# Patient Record
Sex: Male | Born: 1990 | Race: Black or African American | Hispanic: No | Marital: Single | Smoking: Never smoker
Health system: Southern US, Community
[De-identification: ages and names within clinical notes are randomized; demographics above are authoritative.]

## PROBLEM LIST (undated history)

## (undated) DIAGNOSIS — J45909 Unspecified asthma, uncomplicated: Secondary | ICD-10-CM

## (undated) DIAGNOSIS — R0981 Nasal congestion: Secondary | ICD-10-CM

## (undated) DIAGNOSIS — R040 Epistaxis: Secondary | ICD-10-CM

## (undated) HISTORY — PX: OTHER SURGICAL HISTORY: SHX169

## (undated) HISTORY — DX: Nasal congestion: R09.81

## (undated) NOTE — *Deleted (*Deleted)
Perennial and seasonal allergic rhinitis Continue Xyzal 5 mg once a day as needed for runny nose Continue azelastine/fluticasone nasal spray using 1 spray each nostril twice a day as needed for runny/stuffy nsoe May use saline nasal rinse or saline nasal spray as needed for nasal symptoms. Use this prior to any medicated nasal spray  Consider allergy injections as a means of long-term control. - Allergy injections "re-train" and "reset" the immune system to ignore environmental allergens and decrease the resulting immune response to those allergens (sneezing, itchy watery eyes, runny nose, nasal congestion, etc).    - Allergy injections improve symptoms in 75-85% of patients.   - We can discuss this more at the next appointment if the medications are not working for you.  Allergic conjunctivitis Continue Pataday 1 drop each eye once a day as needed for itchy watery eyes May use Natural Tears ( eye lubricant) as needed.  Please let us know if this treatment plan is not working well for you. Schedule a follow up appointment in

---
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
## 2011-05-01 ENCOUNTER — Encounter: Payer: Self-pay | Admitting: *Deleted

## 2011-05-01 ENCOUNTER — Emergency Department (HOSPITAL_BASED_OUTPATIENT_CLINIC_OR_DEPARTMENT_OTHER)
Admission: EM | Admit: 2011-05-01 | Discharge: 2011-05-01 | Disposition: A | Discharge status: Home / Self Care | Payer: Self-pay | Attending: Emergency Medicine | Admitting: Emergency Medicine

## 2011-05-01 DIAGNOSIS — H6690 Otitis media, unspecified, unspecified ear: Secondary | ICD-10-CM

## 2011-05-01 DIAGNOSIS — H9209 Otalgia, unspecified ear: Secondary | ICD-10-CM | POA: Insufficient documentation

## 2011-05-01 MED ORDER — ANTIPYRINE-BENZOCAINE 5.4-1.4 % OT SOLN
3.0000 [drp] | OTIC | Status: DC | PRN
  Administered 2011-05-01: 2 [drp] via OTIC
  Filled 2011-05-01: qty 10

## 2011-05-01 MED ORDER — AMOXICILLIN 500 MG PO CAPS: 500.0000 mg | ORAL_CAPSULE | Freq: Three times a day (TID) | ORAL | Status: AC

## 2011-05-01 NOTE — ED Notes (Signed)
Pt c/o right ear pain.

## 2011-05-01 NOTE — ED Provider Notes (Signed)
Medical screening examination/treatment/procedure(s) were performed by non-physician practitioner and as supervising physician I was immediately available for consultation/collaboration.   Lyanne Co, MD 05/01/11 [phone]

## 2011-05-01 NOTE — ED Provider Notes (Signed)
History     CSN: [id number] Arrival date & time: 05/01/2011  7:06 PM  Chief Complaint  Patient presents with  . Otalgia    (Consider location/radiation/quality/duration/timing/severity/associated sxs/prior treatment) Patient is a 20 y.o. male presenting with ear pain. The history is provided by the patient. No language interpreter was used.  Otalgia This is a new problem. The current episode started 3 to 5 hours ago. There is pain in the right ear. The problem occurs constantly. The problem has not changed since onset.There has been no fever. The pain is severe. Pertinent negatives include no rhinorrhea, no sore throat, no neck pain and no rash.    History reviewed. No pertinent past medical history.  History reviewed. No pertinent past surgical history.  History reviewed. No pertinent family history.  History  Substance Use Topics  . Smoking status: Never Smoker   . Smokeless tobacco: Not on file  . Alcohol Use: No      Review of Systems  Constitutional: Negative.   HENT: Positive for ear pain. Negative for sore throat, rhinorrhea and neck pain.   Respiratory: Negative.   Cardiovascular: Negative.   Skin: Negative for rash.  Neurological: Negative.     Allergies  Review of patient's allergies indicates no known allergies.  Home Medications   Current Outpatient Rx  Name Route Sig Dispense Refill  . AMOXICILLIN 500 MG PO CAPS Oral Take 1 capsule (500 mg total) by mouth 3 (three) times daily. 30 capsule 0    BP 140/89  Pulse 65  Temp(Src) 98.1 F (36.7 C) (Oral)  Resp 16  Ht 5\' 11"  (1.803 m)  Wt 180 lb (81.647 kg)  BMI 25.10 kg/m2  SpO2 100%  Physical Exam  Nursing note and vitals reviewed. Constitutional: He appears well-developed and well-nourished.  HENT:  Head: Normocephalic.  Right Ear: Tympanic membrane is erythematous and bulging.  Left Ear: Tympanic membrane normal.  Mouth/Throat: Oropharynx is clear and moist.  Eyes: Pupils are equal, round,  and reactive to light.  Neck: Normal range of motion. Neck supple.  Cardiovascular: Normal rate and regular rhythm.   Pulmonary/Chest: Effort normal and breath sounds normal.  Musculoskeletal: Normal range of motion.    ED Course  Procedures (including critical care time)  Labs Reviewed - No data to display No results found.   1. Otitis media       MDM  Will treat for otitis        Teressa Lower, NP 05/01/11 1952

## 2012-07-12 ENCOUNTER — Emergency Department (HOSPITAL_BASED_OUTPATIENT_CLINIC_OR_DEPARTMENT_OTHER): Payer: Self-pay

## 2012-07-12 ENCOUNTER — Encounter (HOSPITAL_BASED_OUTPATIENT_CLINIC_OR_DEPARTMENT_OTHER): Payer: Self-pay | Admitting: *Deleted

## 2012-07-12 ENCOUNTER — Emergency Department (HOSPITAL_BASED_OUTPATIENT_CLINIC_OR_DEPARTMENT_OTHER)
Admission: EM | Admit: 2012-07-12 | Discharge: 2012-07-12 | Disposition: A | Discharge status: Home / Self Care | Payer: Self-pay | Attending: Emergency Medicine | Admitting: Emergency Medicine

## 2012-07-12 DIAGNOSIS — R079 Chest pain, unspecified: Secondary | ICD-10-CM

## 2012-07-12 DIAGNOSIS — R072 Precordial pain: Secondary | ICD-10-CM | POA: Insufficient documentation

## 2012-07-12 DIAGNOSIS — R42 Dizziness and giddiness: Secondary | ICD-10-CM | POA: Insufficient documentation

## 2012-07-12 DIAGNOSIS — F1210 Cannabis abuse, uncomplicated: Secondary | ICD-10-CM | POA: Insufficient documentation

## 2012-07-12 HISTORY — DX: Epistaxis: R04.0

## 2012-07-12 NOTE — ED Provider Notes (Signed)
History  This chart was scribed for Geoffery Lyons, MD by Shari Heritage, ED Scribe. The patient was seen in room MH11/MH11. Patient's care was started at 1704.  CSN: [id number]  Arrival date & time 07/12/12  1632   First MD Initiated Contact with Patient 07/12/12 1704      Chief Complaint  Patient presents with  . Chest Pain    The history is provided by the patient. No language interpreter was used.   HPI Comments: Jimmy Mcdaniel is a 21 y.o. male who presents to the Emergency Department complaining of intermittent, moderate, non-radiating chest pain onset 2 weeks ago. Patient describes the pain as sharp or cramping in the midsternal and left chest areas. He states that he gets pain episodes up 10 minutes long 1 to 2 times per day. The pain is not worse with exertion or after eating certain foods. Patient states that he became lightheaded a couple of days ago with the pain, but no other consistent symptoms. Patient denies shortness of breath. Patient has never had an EKG done before. Patient says that he works at a [employer] and does occasional heavy lifting. Patient has no significant past medical history. Patient does not smoke cigarettes or cigars. He smokes marijuana occasionally.    Past Medical History  Diagnosis Date  . Nosebleed     Past Surgical History  Procedure Date  . Head surgery     History reviewed. No pertinent family history.  History  Substance Use Topics  . Smoking status: Current Some Day Smoker  . Smokeless tobacco: Not on file  . Alcohol Use: No      Review of Systems  Respiratory: Negative for shortness of breath.   Cardiovascular: Positive for chest pain.  Neurological: Positive for light-headedness.  All other systems reviewed and are negative.    Allergies  Review of patient's allergies indicates no known allergies.  Home Medications  No current outpatient prescriptions on file.  Triage Vitals: BP 120/82  Pulse 58  Temp 98.1  F (36.7 C) (Oral)  Resp 18  Ht 5\' 11"  (1.803 m)  Wt 185 lb (83.915 kg)  BMI 25.80 kg/m2  SpO2 100%  Physical Exam  Constitutional: He is oriented to person, place, and time. He appears well-developed and well-nourished. No distress.  HENT:  Head: Normocephalic and atraumatic.  Eyes: Conjunctivae normal and EOM are normal. Pupils are equal, round, and reactive to light.  Neck: Neck supple.  Cardiovascular: Normal rate and regular rhythm.   No murmur heard. Pulmonary/Chest: Effort normal and breath sounds normal. No respiratory distress. He has no wheezes. He has no rales. He exhibits no tenderness.  Abdominal: Soft. Bowel sounds are normal. He exhibits no distension. There is no tenderness. There is no rebound and no guarding.  Musculoskeletal: He exhibits no edema and no tenderness.  Neurological: He is alert and oriented to person, place, and time.  Skin: Skin is warm and dry. No rash noted.    ED Course  Procedures (including critical care time) DIAGNOSTIC STUDIES: Oxygen Saturation is 100% on room air, normal by my interpretation.    COORDINATION OF CARE: 5:10 PM- Patient informed of current plan for treatment and evaluation and agrees with plan at this time. Will order chest x-ray to rule out significant medical conditions.   Labs Reviewed - No data to display  Dg Chest 2 View  07/12/2012  *RADIOLOGY REPORT*  Clinical Data: Chest pain  CHEST - 2 VIEW  Comparison: 07/06/2010  Findings:  Upper-normal size of cardiac silhouette. Mediastinal contours and pulmonary vascularity normal. Minimal chronic peribronchial thickening. No pulmonary infiltrate, pleural effusion, or pneumothorax. Bones unremarkable.  IMPRESSION: No acute abnormalities.   Original Report Authenticated By: Ulyses Southward, M.D.      No diagnosis found.   Date: 07/12/2012  Rate: 59  Rhythm: sinus bradycardia  QRS Axis: normal  Intervals: normal  ST/T Wave abnormalities: early repolarization  Conduction  Disutrbances:none  Narrative Interpretation:   Old EKG Reviewed: none available    MDM  Very low suspicion for cardiac etiology.  No risk factors, symptoms atypical, and workup unremarkable. Will discharge to home with nsaids, return prn.      I personally performed the services described in this documentation, which was scribed in my presence. The recorded information has been reviewed and is accurate.      Geoffery Lyons, MD 07/12/12 2114

## 2012-07-12 NOTE — ED Notes (Signed)
MD at bedside. 

## 2012-07-12 NOTE — Discharge Instructions (Signed)
Chest Pain (Nonspecific) It is often hard to give a specific diagnosis for the cause of chest pain. There is always a chance that your pain could be related to something serious, such as a heart attack or a blood clot in the lungs. You need to follow up with your caregiver for further evaluation. CAUSES   Heartburn.  Pneumonia or bronchitis.  Anxiety or stress.  Inflammation around your heart (pericarditis) or lung (pleuritis or pleurisy).  A blood clot in the lung.  A collapsed lung (pneumothorax). It can develop suddenly on its own (spontaneous pneumothorax) or from injury (trauma) to the chest.  Shingles infection (herpes zoster virus). The chest wall is composed of bones, muscles, and cartilage. Any of these can be the source of the pain.  The bones can be bruised by injury.  The muscles or cartilage can be strained by coughing or overwork.  The cartilage can be affected by inflammation and become sore (costochondritis). DIAGNOSIS  Lab tests or other studies, such as X-rays, electrocardiography, stress testing, or cardiac imaging, may be needed to find the cause of your pain.  TREATMENT   Treatment depends on what may be causing your chest pain. Treatment may include:  Acid blockers for heartburn.  Anti-inflammatory medicine.  Pain medicine for inflammatory conditions.  Antibiotics if an infection is present.  You may be advised to change lifestyle habits. This includes stopping smoking and avoiding alcohol, caffeine, and chocolate.  You may be advised to keep your head raised (elevated) when sleeping. This reduces the chance of acid going backward from your stomach into your esophagus.  Most of the time, nonspecific chest pain will improve within 2 to 3 days with rest and mild pain medicine. HOME CARE INSTRUCTIONS   If antibiotics were prescribed, take your antibiotics as directed. Finish them even if you start to feel better.  For the next few days, avoid physical  activities that bring on chest pain. Continue physical activities as directed.  Do not smoke.  Avoid drinking alcohol.  Only take over-the-counter or prescription medicine for pain, discomfort, or fever as directed by your caregiver.  Follow your caregiver's suggestions for further testing if your chest pain does not go away.  Keep any follow-up appointments you made. If you do not go to an appointment, you could develop lasting (chronic) problems with pain. If there is any problem keeping an appointment, you must call to reschedule. SEEK MEDICAL CARE IF:   You think you are having problems from the medicine you are taking. Read your medicine instructions carefully.  Your chest pain does not go away, even after treatment.  You develop a rash with blisters on your chest. SEEK IMMEDIATE MEDICAL CARE IF:   You have increased chest pain or pain that spreads to your arm, neck, jaw, back, or abdomen.  You develop shortness of breath, an increasing cough, or you are coughing up blood.  You have severe back or abdominal pain, feel nauseous, or vomit.  You develop severe weakness, fainting, or chills.  You have a fever. THIS IS AN EMERGENCY. Do not wait to see if the pain will go away. Get medical help at once. Call your local emergency services (911 in [place] Do not drive yourself to the hospital. MAKE SURE YOU:   Understand these instructions.  Will watch your condition.  Will get help right away if you are not doing well or get worse. Document Released: 04/17/2005 Document Revised: 09/30/2011 Document Reviewed: 02/11/2008 [employer] Patient Information 2013 [employer].  

## 2012-07-12 NOTE — ED Notes (Signed)
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 Denies other s/s except nosebleeds, which he has frequently. Admits to marijuana use.

## 2012-07-12 NOTE — ED Notes (Signed)
Patient transported to X-ray and returned 

## 2015-09-06 ENCOUNTER — Emergency Department (HOSPITAL_BASED_OUTPATIENT_CLINIC_OR_DEPARTMENT_OTHER)
Admission: EM | Admit: 2015-09-06 | Discharge: 2015-09-06 | Disposition: A | Discharge status: Home / Self Care | Payer: Self-pay | Attending: Emergency Medicine | Admitting: Emergency Medicine

## 2015-09-06 ENCOUNTER — Encounter (HOSPITAL_BASED_OUTPATIENT_CLINIC_OR_DEPARTMENT_OTHER): Payer: Self-pay

## 2015-09-06 DIAGNOSIS — Y998 Other external cause status: Secondary | ICD-10-CM | POA: Insufficient documentation

## 2015-09-06 DIAGNOSIS — W228XXA Striking against or struck by other objects, initial encounter: Secondary | ICD-10-CM | POA: Insufficient documentation

## 2015-09-06 DIAGNOSIS — S99922A Unspecified injury of left foot, initial encounter: Secondary | ICD-10-CM

## 2015-09-06 DIAGNOSIS — Y9389 Activity, other specified: Secondary | ICD-10-CM | POA: Insufficient documentation

## 2015-09-06 DIAGNOSIS — Y9289 Other specified places as the place of occurrence of the external cause: Secondary | ICD-10-CM | POA: Insufficient documentation

## 2015-09-06 DIAGNOSIS — Z23 Encounter for immunization: Secondary | ICD-10-CM | POA: Insufficient documentation

## 2015-09-06 DIAGNOSIS — S90412A Abrasion, left great toe, initial encounter: Secondary | ICD-10-CM | POA: Insufficient documentation

## 2015-09-06 MED ORDER — TETANUS-DIPHTH-ACELL PERTUSSIS 5-2.5-18.5 LF-MCG/0.5 IM SUSP
0.5000 mL | Freq: Once | INTRAMUSCULAR | Status: AC
  Administered 2015-09-06: 0.5 mL via INTRAMUSCULAR
  Filled 2015-09-06: qty 0.5

## 2015-09-06 MED ORDER — BACITRACIN ZINC 500 UNIT/GM EX OINT
TOPICAL_OINTMENT | Freq: Two times a day (BID) | CUTANEOUS | Status: DC
  Administered 2015-09-06: 1 via TOPICAL

## 2015-09-06 MED ORDER — BUPIVACAINE HCL (PF) 0.5 % IJ SOLN
10.0000 mL | Freq: Once | INTRAMUSCULAR | Status: AC
  Administered 2015-09-06: 10 mL
  Filled 2015-09-06: qty 10

## 2015-09-06 NOTE — ED Provider Notes (Signed)
CSN: [id number]     Arrival date & time 09/06/15  4098 History   First MD Initiated Contact with Patient 09/06/15 0915     Chief Complaint  Patient presents with  . Toe Injury     (Consider location/radiation/quality/duration/timing/severity/associated sxs/prior Treatment) HPI   Jimmy Mcdaniel is a 25 y.o. male, patient with no pertinent past medical history, presenting to the ED with injury to the left great toe that occurred yesterday. Patient states that he hit the toe against a bedpost. Complains of a small wound and that part of the nail fell off. Patient has not done anything to treat this wound. Patient rates his pain at 6 out of 10, throbbing, nonradiating. Patient denies falls, other injuries, neuro deficits, or any other complaints.    Past Medical History  Diagnosis Date  . Nosebleed    Past Surgical History  Procedure Laterality Date  . Head surgery     History reviewed. No pertinent family history. Social History  Substance Use Topics  . Smoking status: Never Smoker   . Smokeless tobacco: Never Used  . Alcohol Use: Yes     Comment: social    Review of Systems  Skin: Positive for wound (Left great toe and nail).  Neurological: Negative for weakness and numbness.      Allergies  Review of patient's allergies indicates no known allergies.  Home Medications   Prior to Admission medications   Not on File   BP 117/61 mmHg  Pulse 48  Temp(Src) 97.5 F (36.4 C) (Oral)  Resp 18  Ht 6' (1.829 m)  Wt 95.255 kg  BMI 28.47 kg/m2  SpO2 98% Physical Exam  Constitutional: He appears well-developed and well-nourished. No distress.  HENT:  Head: Normocephalic and atraumatic.  Eyes: Conjunctivae are normal. Pupils are equal, round, and reactive to light.  Cardiovascular: Normal rate and regular rhythm.   Pulmonary/Chest: Effort normal.  Musculoskeletal:  Range of motion intact. No deformity, swelling, erythema, or tenderness proximal to the nail.   Neurological: He is alert.  No sensory deficits.  Skin: Skin is warm and dry. He is not diaphoretic.  Small abrasion to the superior lateral section of the left great toe. Partial nail is missing. Small fragment still present. Tenderness to the superior left great toe.  Nursing note and vitals reviewed.   ED Course  .Nerve Block Date/Time: 09/06/2015 9:34 AM Performed by: Anselm Pancoast Authorized by: Anselm Pancoast Consent: Verbal consent obtained. Risks and benefits: risks, benefits and alternatives were discussed Consent given by: patient Patient understanding: patient states understanding of the procedure being performed Patient consent: the patient's understanding of the procedure matches consent given Procedure consent: procedure consent matches procedure scheduled Patient identity confirmed: verbally with patient and arm band Time out: Immediately prior to procedure a "time out" was called to verify the correct patient, procedure, equipment, support staff and site/side marked as required. Indications: pain relief Body area: lower extremity Nerve: digital Laterality: left Patient sedated: no Preparation: Patient was prepped and draped in the usual sterile fashion. Patient position: supine Needle gauge: 25 G Location technique: anatomical landmarks Local anesthetic: bupivacaine 0.5% without epinephrine Anesthetic total: 2 ml Outcome: pain improved Patient tolerance: Patient tolerated the procedure well with no immediate complications  .Nail Removal Date/Time: 09/06/2015 9:34 AM Performed by: Anselm Pancoast Authorized by: Harolyn Rutherford C Consent: Verbal consent obtained. Risks and benefits: risks, benefits and alternatives were discussed Consent given by: patient Patient understanding: patient states understanding of the procedure  being performed Patient consent: the patient's understanding of the procedure matches consent given Procedure consent: procedure consent matches  procedure scheduled Patient identity confirmed: verbally with patient and arm band Time out: Immediately prior to procedure a "time out" was called to verify the correct patient, procedure, equipment, support staff and site/side marked as required. Location: left foot Location details: left big toe Anesthesia: digital block Local anesthetic: bupivacaine 0.5% without epinephrine Anesthetic total: 2 ml Patient sedated: no Preparation: skin prepped with Betadine and sterile field established Amount removed: 1/5 Wedge excision of skin of nail fold: no Nail bed sutured: no Nail matrix removed: none Removed nail replaced and anchored: no Dressing: 4x4 and antibiotic ointment Patient tolerance: Patient tolerated the procedure well with no immediate complications   (including critical care time)   MDM   Final diagnoses:  Toe injury, left, initial encounter    Jimmy Mcdaniel presents with injury to left great toe.  This patient does not have any indications for imaging at this time. Regardless, the patient was offered an x-ray, but declined. Patient was given the option to have the rest of the nail fragment removed or to perform home wound care and see how the wound faired. Patient opted for the digital block with the partial nail removal. Tetanus updated here in the ED. patient tolerated nail removal well. Patient given instructions for home wound care as well as return precautions. Patient voiced understanding of these instructions and is comfortable with the plan.    Anselm Pancoast, PA-C 09/06/15 1655  Derwood Kaplan, MD 09/08/15 [phone]

## 2015-09-06 NOTE — Discharge Instructions (Signed)
You have been seen today for a toe injury. Follow up with PCP as needed. Return to ED should symptoms worsen. Keep the area clean and dry as much as possible. Soak in Epsom salt pads daily. Return to the ED or go to a PCP office should signs of infection occur.   Emergency Department Resource Guide 1) Find a Doctor and Pay Out of Pocket Although you won't have to find out who is covered by your insurance plan, it is a good idea to ask around and get recommendations. You will then need to call the office and see if the doctor you have chosen will accept you as a new patient and what types of options they offer for patients who are self-pay. Some doctors offer discounts or will set up payment plans for their patients who do not have insurance, but you will need to ask so you aren't surprised when you get to your appointment.  2) Contact Your Local Health Department Not all health departments have doctors that can see patients for sick visits, but many do, so it is worth a call to see if yours does. If you don't know where your local health department is, you can check in your phone book. The CDC also has a tool to help you locate your state's health department, and many state websites also have listings of all of their local health departments.  3) Find a Walk-in Clinic If your illness is not likely to be very severe or complicated, you may want to try a walk in clinic. These are popping up all over the country in pharmacies, drugstores, and shopping centers. They're usually staffed by nurse practitioners or physician assistants that have been trained to treat common illnesses and complaints. They're usually fairly quick and inexpensive. However, if you have serious medical issues or chronic medical problems, these are probably not your best option.  No Primary Care Doctor: - Call Health Connect at  [phone] - they can help you locate a primary care doctor that  accepts your insurance, provides certain  services, etc. - Physician Referral Service- [phone]  Chronic Pain Problems: Organization         Address  Phone   Notes  [facility]  [phone] Patients need to be referred by their primary care doctor.   Medication Assistance: Organization         Address  Phone   Notes  [facility] Medication [facility] [address] [place] [place], [place] [address] [phone] --Must be a resident of [facility] -- Must have NO insurance coverage whatsoever (no Medicaid/ Medicare, etc.) -- The pt. MUST have a primary care doctor that directs their care regularly and follows them in the community   [facility]  [phone]   [employer]  [phone]    Agencies that provide inexpensive medical care: Organization         Address  Phone   Notes  [facility]  [phone]   [facility]    [phone]   [facility] [address] [place], [place] [address] [phone]   [facility] [address] [place], [place] [phone]   Planned Parenthood    8567172928   [facility]    [phone]   [facility]  [address], [place] Phone:  [phone], Fax:  [phone] Hours of Operation:  9 am - 6 pm, M-F.  Also accepts Medicaid/Medicare and self-pay.  [facility]  [address], [place] Phone: [phone], Fax: [phone]. Hours of Operation:  8:30 am - 5:30 pm, M-F.  Also accepts Medicaid and self-pay.  [facility] [place] [address] [place] Phone: [phone]   [facility] [address] Natasha Bence [place], [place] [phone], Ext. [phone] Mondays & Thursdays: 7-9 AM.  First 15 patients are seen on a first come, first serve basis.    Medicaid-accepting [facility] Providers:  Organization         Address  Phone   Notes  [facility] [address], [place] [phone] Also accepts self-pay patients.  [facility] [address] Laurell Josephs [place], [place]  [phone]   [facility] [address], [place] [phone]   [facility] [address], [place] [phone]   Renaye Rakers [address], [place]   [phone] Only accepts [place] Access [place] patients after they have their name applied to their card.   Self-Pay (no insurance) in [facility]:  Organization         Address  Phone   Notes  Sickle Cell Patients, [facility] [address] [place], [place] [phone]   [facility] [address] [place], [place] [phone]   [facility] [place]  [address], [place] [phone]   [facility]/Dr. Osei-Bonsu  [address], [place] or [address], [place] [phone] Phone number for both [place] and [place] locations is the same.  [facility] [address] [place] [phone]   [facility] [address], [place] or [address] [phone]   [facility] [address] [place] [phone], phone; [phone], fax Sees patients 1st and 3rd Saturday of every month.  Must not qualify for public or private insurance (i.e. Medicaid, Medicare, [place] Health Choice, Veterans' Benefits)  Household income should be no more than 200% of the poverty level The clinic cannot treat you if you are pregnant or think you are pregnant  Sexually transmitted diseases are not treated at the clinic.    Dental Care: Organization         Address  Phone  Notes  [facility] Department of [facility] [address] [place], [place] [phone] Accepts  children up to age 25 who are enrolled in [place] or [place] Health Choice; pregnant women with a Medicaid card; and children who have applied for Medicaid or [place] Health Choice, but were declined, whose parents can pay a reduced fee at time of service.  [facility] Department of [facility]  [address], [place] [phone] Accepts children up to age 77 who are enrolled in [place] or [place] Health Choice; pregnant women with a Medicaid card; and children who have applied for Medicaid or [place] Health Choice, but were declined, whose parents can pay a reduced fee at time of service.  [facility]  [address] [place], [place] [phone] Patients are seen by appointment only. Walk-ins are not accepted. [facility] will see patients 18 years  of age and older. Monday - Tuesday (8am-5pm) Most Wednesdays (8:30-5pm) $30 per visit, cash only  [facility]  [address], [facility] [phone] Patients are seen by appointment only. Walk-ins are not accepted. [facility] will see patients 22 years of age and older. One Wednesday Evening (Monthly: Volunteer Based).  $30 per visit, cash only  [employer]  [phone] for adults; Children under age 53, call Graduate Pediatric Dentistry at [phone]. Children aged 43-14, please call [phone] to request a pediatric application.  Dental services are provided in all areas of dental care including fillings, crowns and bridges, complete and partial dentures, implants, gum treatment, root canals, and extractions. Preventive care is also provided. Treatment is provided to both adults and children. Patients are selected via a lottery and there is often a waiting list.   [facility] [address], [place]  [phone] [email or web address]   [facility] [address] [place], [place] [phone], Ext. [phone] Second and  Fourth Thursday of each month, opens at 6:30 AM; Clinic ends at 9 AM.  Patients are seen on a first-come first-served basis, and a limited number are seen during each clinic.   [facility]  [address] Ether Griffins [place], [place] [phone]   Eligibility Requirements You must have lived in [place], [place], or [place] counties for at least the last three months.   You cannot be eligible for state or federal sponsored [place], including [employer], [place], or [employer].   You generally cannot be eligible for healthcare insurance through your employer.    How to apply: Eligibility screenings are held every Tuesday and Wednesday afternoon from 1:00 pm until 4:00 pm. You do not need an appointment for the interview!  [facility] [address] [place], [place] [phone]   [facility] Health Department  [phone]   [facility] Health Department  [phone]   [facility] Health Department  [phone]    Behavioral Health Resources in the Community: Intensive Outpatient Programs Organization         Address  Phone  Notes  [facility] [address], [place], [place] [phone]   [facility] [address] [place], [place] [phone]   [facility] [address], [place], [place]  [phone]   [facility] [address]  [place], [place] [phone] or [phone]   Substance Abuse Resources Organization         Address  Phone  Notes  [facility]  [phone]   [facility]  [phone]   [facility]  [phone]   Floydene Flock  [phone]   Residential & Outpatient Substance Abuse Program  [phone]   Psychological Services Organization         Address  Phone  Notes  [facility]  [phone]   [facility] Services  [phone]   [facility]  [address] [place] [phone] or [phone]    Mobile Crisis Teams Organization         Address  Phone  Notes  [facility], Mobile Crisis Care Unit  [phone]   [facility]  [address] [place], [place] [phone]   [facility] [address] [place] [phone]    Self-Help/Support Groups Organization  Address  Phone             Notes  [facility] [place] - variety of support groups  336- I7437963 Call for more information  Narcotics Anonymous (NA), [facility] [address], [employer] [place]  2 meetings at this location   Statistician         Address  Phone  Notes  [facility] [address],    [place]  [phone]   [facility]  [address] [place] [address], [place], [place] [phone]   [facility] [address] [place], [place] [phone] Admissions: 8am-3pm M-F  [facility] [address]    [place], [place] [phone]   [facility] [address] [place], [place], [place] [phone]   [facility] [address],  [place], [place] [phone]   [facility] - Intensive Outpatient [address]ure[address], [place], [place] [phone]   [facility] ([facility] [address] [place]  [place], [place] [phone] or [phone]   [facility] ([facility]) [address] [place], [place] [phone] Accepts Medicaid  [facility] [address],  [place] [phone] Substance Abuse/Addiction Treatment   [facility] Organization         Address  Phone  Notes  [facility]  [phone]   Angie Fava, PhD [address] Ervin Knack [place], [place]   [phone] or [phone]   [facility] Behavioral   [address] [place], [place] [phone]   [facility] [address], [place], [place] [phone] Insurance/Medicaid/sponsorship through [facility] and Families [address]                                    [place], [place] [phone] Therapy/tele-psych/case  [facility] [address], [place] [phone]    Dr. Lolly Mustache  [phone]   [facility]  [employer] [facility] Dept. 1) [address] [place] [address], [place] 2) [address], [place] 3)  [address], [place] [phone]  [phone]   [facility] Child Abuse Hotline (579)432-4774 or [phone] (After Hours)
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
## 2016-03-02 ENCOUNTER — Encounter (HOSPITAL_BASED_OUTPATIENT_CLINIC_OR_DEPARTMENT_OTHER): Payer: Self-pay | Admitting: *Deleted

## 2016-03-02 ENCOUNTER — Emergency Department (HOSPITAL_BASED_OUTPATIENT_CLINIC_OR_DEPARTMENT_OTHER)
Admission: EM | Admit: 2016-03-02 | Discharge: 2016-03-02 | Discharge status: Against Medical Advice | Payer: Self-pay | Attending: Emergency Medicine | Admitting: Emergency Medicine

## 2016-03-02 ENCOUNTER — Emergency Department (HOSPITAL_BASED_OUTPATIENT_CLINIC_OR_DEPARTMENT_OTHER): Payer: Self-pay

## 2016-03-02 DIAGNOSIS — M25562 Pain in left knee: Secondary | ICD-10-CM | POA: Insufficient documentation

## 2016-03-02 DIAGNOSIS — M79644 Pain in right finger(s): Secondary | ICD-10-CM | POA: Insufficient documentation

## 2016-03-02 NOTE — ED Notes (Signed)
Patient seen leaving 

## 2016-03-02 NOTE — ED Triage Notes (Signed)
Patient states he injured his right thumb playing basketball several weeks ago.  Feels like it was jammed, but is not getting any better.  Also, c/o left knee pain which is chronic.

## 2016-03-02 NOTE — ED Notes (Addendum)
Registration visualized pt walking out. Pt NAD. Pt had been seen by the PA and did not stay for orders. PA advised she was not going to send pt home with rx.
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
## 2016-04-11 ENCOUNTER — Encounter (HOSPITAL_BASED_OUTPATIENT_CLINIC_OR_DEPARTMENT_OTHER): Payer: Self-pay | Admitting: Emergency Medicine

## 2016-04-11 ENCOUNTER — Emergency Department (HOSPITAL_BASED_OUTPATIENT_CLINIC_OR_DEPARTMENT_OTHER)
Admission: EM | Admit: 2016-04-11 | Discharge: 2016-04-12 | Disposition: A | Discharge status: Home / Self Care | Payer: Self-pay | Attending: Emergency Medicine | Admitting: Emergency Medicine

## 2016-04-11 DIAGNOSIS — W500XXA Accidental hit or strike by another person, initial encounter: Secondary | ICD-10-CM | POA: Insufficient documentation

## 2016-04-11 DIAGNOSIS — Y999 Unspecified external cause status: Secondary | ICD-10-CM | POA: Insufficient documentation

## 2016-04-11 DIAGNOSIS — Y929 Unspecified place or not applicable: Secondary | ICD-10-CM | POA: Insufficient documentation

## 2016-04-11 DIAGNOSIS — Y939 Activity, unspecified: Secondary | ICD-10-CM | POA: Insufficient documentation

## 2016-04-11 DIAGNOSIS — S01511A Laceration without foreign body of lip, initial encounter: Secondary | ICD-10-CM | POA: Insufficient documentation
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
## 2016-04-12 MED ORDER — LIDOCAINE HCL (PF) 1 % IJ SOLN
INTRAMUSCULAR | Status: AC
  Filled 2016-04-12: qty 5
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
## 2016-06-20 ENCOUNTER — Emergency Department (HOSPITAL_BASED_OUTPATIENT_CLINIC_OR_DEPARTMENT_OTHER)
Admission: EM | Admit: 2016-06-20 | Discharge: 2016-06-20 | Disposition: A | Discharge status: Home / Self Care | Payer: Self-pay | Attending: Emergency Medicine | Admitting: Emergency Medicine

## 2016-06-20 ENCOUNTER — Encounter (HOSPITAL_BASED_OUTPATIENT_CLINIC_OR_DEPARTMENT_OTHER): Payer: Self-pay | Admitting: *Deleted

## 2016-06-20 DIAGNOSIS — R51 Headache: Secondary | ICD-10-CM | POA: Insufficient documentation

## 2016-06-20 DIAGNOSIS — R519 Headache, unspecified: Secondary | ICD-10-CM

## 2016-06-20 MED ORDER — DIPHENHYDRAMINE HCL 50 MG/ML IJ SOLN
25.0000 mg | Freq: Once | INTRAMUSCULAR | Status: AC
  Administered 2016-06-20: 25 mg via INTRAVENOUS
  Filled 2016-06-20: qty 1

## 2016-06-20 MED ORDER — KETOROLAC TROMETHAMINE 15 MG/ML IJ SOLN
15.0000 mg | Freq: Once | INTRAMUSCULAR | Status: DC
  Filled 2016-06-20: qty 1

## 2016-06-20 MED ORDER — SODIUM CHLORIDE 0.9 % IV BOLUS (SEPSIS)
1000.0000 mL | Freq: Once | INTRAVENOUS | Status: AC
  Administered 2016-06-20: 1000 mL via INTRAVENOUS

## 2016-06-20 MED ORDER — METOCLOPRAMIDE HCL 5 MG/ML IJ SOLN
10.0000 mg | Freq: Once | INTRAMUSCULAR | Status: AC
  Administered 2016-06-20: 10 mg via INTRAVENOUS
  Filled 2016-06-20: qty 2

## 2016-06-20 NOTE — ED Triage Notes (Signed)
Pt states constant headache for 5 days, has been using OTC meds without relief. Last ibuprofen approx 1 hour ago.
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
## 2016-06-20 NOTE — ED Notes (Signed)
ED Provider at bedside. 

## 2016-06-27 ENCOUNTER — Encounter (HOSPITAL_BASED_OUTPATIENT_CLINIC_OR_DEPARTMENT_OTHER): Payer: Self-pay | Admitting: Emergency Medicine

## 2016-06-27 ENCOUNTER — Emergency Department (HOSPITAL_BASED_OUTPATIENT_CLINIC_OR_DEPARTMENT_OTHER)
Admission: EM | Admit: 2016-06-27 | Discharge: 2016-06-27 | Disposition: A | Discharge status: Home / Self Care | Payer: Self-pay | Attending: Emergency Medicine | Admitting: Emergency Medicine

## 2016-06-27 DIAGNOSIS — K5900 Constipation, unspecified: Secondary | ICD-10-CM | POA: Insufficient documentation

## 2016-06-27 DIAGNOSIS — R001 Bradycardia, unspecified: Secondary | ICD-10-CM | POA: Insufficient documentation

## 2016-06-27 DIAGNOSIS — R5383 Other fatigue: Secondary | ICD-10-CM | POA: Insufficient documentation

## 2016-06-27 MED ORDER — DOCUSATE SODIUM 100 MG PO CAPS: 100.0000 mg | ORAL_CAPSULE | Freq: Two times a day (BID) | ORAL | 0 refills | Status: DC

## 2016-06-27 MED ORDER — POLYETHYLENE GLYCOL 3350 17 GM/SCOOP PO POWD: 17.0000 g | Freq: Every day | ORAL | 0 refills | Status: DC

## 2016-06-27 NOTE — ED Triage Notes (Signed)
Pt c/o constipation since visit here on 11/30; no BM since then. Denies abd pain; voiding normally. Appetite good.
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
## 2016-06-27 NOTE — Discharge Instructions (Signed)
Please read and follow all provided instructions.  Your diagnoses today include:  1. Constipation, unspecified constipation type   2. Fatigue, unspecified type     Tests performed today include:  Vital signs. See below for your results today.   Medications prescribed:   Miralax - laxative  This medication can be found over-the-counter.    Colace - stool softener  This medication can be found over-the-counter.   Continue colace for 2 weeks after your stools return to normal to prevent constipation.   Take any medications only as directed on prescription or on packaging.   Home care instructions:  Follow any educational materials contained in this packet.  Follow-up instructions: Please follow-up with your primary care provider in the next week for further evaluation of your symptoms. This is especially important if your symptoms of fatigue continue. You would need other screening tests performed by a primary care doctor.   Return instructions:   Please return to the Emergency Department if you experience worsening symptoms.   Please return if you have worsening abdominal pain, swelling of your abdomen, persistent vomiting, blood in your stool or vomit, or fever.   Please return if you have any other emergent concerns.  Additional Information:  Your vital signs today were: BP 111/73 (BP Location: Left Arm)    Pulse (!) 51    Temp 97.6 F (36.4 C) (Oral)    Resp 18    Ht 6' (1.829 m)    Wt 90.7 kg    SpO2 99%    BMI 27.12 kg/m  If your blood pressure (BP) was elevated above 135/85 this visit, please have this repeated by your doctor within one month. --------------

## 2016-07-02 ENCOUNTER — Encounter (HOSPITAL_BASED_OUTPATIENT_CLINIC_OR_DEPARTMENT_OTHER): Payer: Self-pay | Admitting: Emergency Medicine

## 2016-07-02 ENCOUNTER — Emergency Department (HOSPITAL_BASED_OUTPATIENT_CLINIC_OR_DEPARTMENT_OTHER): Payer: Worker's Compensation

## 2016-07-02 ENCOUNTER — Emergency Department (HOSPITAL_BASED_OUTPATIENT_CLINIC_OR_DEPARTMENT_OTHER)
Admission: EM | Admit: 2016-07-02 | Discharge: 2016-07-02 | Disposition: A | Discharge status: Home / Self Care | Payer: Worker's Compensation | Attending: Emergency Medicine | Admitting: Emergency Medicine

## 2016-07-02 DIAGNOSIS — W208XXA Other cause of strike by thrown, projected or falling object, initial encounter: Secondary | ICD-10-CM | POA: Insufficient documentation

## 2016-07-02 DIAGNOSIS — Y939 Activity, unspecified: Secondary | ICD-10-CM | POA: Insufficient documentation

## 2016-07-02 DIAGNOSIS — S8992XA Unspecified injury of left lower leg, initial encounter: Secondary | ICD-10-CM | POA: Diagnosis present

## 2016-07-02 DIAGNOSIS — M25562 Pain in left knee: Secondary | ICD-10-CM | POA: Insufficient documentation

## 2016-07-02 DIAGNOSIS — Y990 Civilian activity done for income or pay: Secondary | ICD-10-CM | POA: Diagnosis not present

## 2016-07-02 DIAGNOSIS — Y929 Unspecified place or not applicable: Secondary | ICD-10-CM | POA: Diagnosis not present

## 2016-07-02 NOTE — ED Triage Notes (Signed)
Patient states he was at work when some items on a pallet fell onto his knee. Patient is ambulatory into triage room. Patient states he applied ice without relief. Patient is filing this injury with worker's comp.

## 2016-07-02 NOTE — Discharge Instructions (Signed)
Take Tylenol or Motrin as needed for pain. Follow-up with orthopedics if you continue having problems with her knee. Return here for any new or worsening symptoms.

## 2016-07-02 NOTE — ED Notes (Signed)
UDS & BAT completed. Pt called Mr. Hall and he stated to have them done here. The only forms presented to me were the white forms, there were no green forms given to him by employer.
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
## 2016-10-31 ENCOUNTER — Ambulatory Visit (INDEPENDENT_AMBULATORY_CARE_PROVIDER_SITE_OTHER): Payer: Self-pay | Admitting: Family Medicine

## 2016-10-31 ENCOUNTER — Emergency Department (HOSPITAL_BASED_OUTPATIENT_CLINIC_OR_DEPARTMENT_OTHER): Payer: Self-pay

## 2016-10-31 ENCOUNTER — Encounter (HOSPITAL_BASED_OUTPATIENT_CLINIC_OR_DEPARTMENT_OTHER): Payer: Self-pay | Admitting: *Deleted

## 2016-10-31 ENCOUNTER — Emergency Department (HOSPITAL_BASED_OUTPATIENT_CLINIC_OR_DEPARTMENT_OTHER)
Admission: EM | Admit: 2016-10-31 | Discharge: 2016-10-31 | Disposition: A | Discharge status: Home / Self Care | Payer: Self-pay | Attending: Emergency Medicine | Admitting: Emergency Medicine

## 2016-10-31 ENCOUNTER — Encounter: Payer: Self-pay | Admitting: Family Medicine

## 2016-10-31 DIAGNOSIS — S8991XA Unspecified injury of right lower leg, initial encounter: Secondary | ICD-10-CM

## 2016-10-31 DIAGNOSIS — M25561 Pain in right knee: Secondary | ICD-10-CM | POA: Insufficient documentation

## 2016-10-31 MED ORDER — NAPROXEN 500 MG PO TABS: 500.0000 mg | ORAL_TABLET | Freq: Two times a day (BID) | ORAL | 0 refills | Status: DC

## 2016-10-31 MED ORDER — ACETAMINOPHEN 500 MG PO TABS
1000.0000 mg | ORAL_TABLET | Freq: Once | ORAL | Status: AC
  Administered 2016-10-31: 1000 mg via ORAL
  Filled 2016-10-31: qty 2

## 2016-10-31 NOTE — Patient Instructions (Signed)
You strained your patellar tendon. The other ligaments, menisci of your knee are intact. Ice the area 15 minutes at a time 3-4 times a day. Ibuprofen  three times a day with food OR aleve 2 tabs twice a day with food for pain and inflammation - take regularly for 7-10 days then as needed. Use crutches as needed. Start knee extension and straight leg raise exercises when you're able to - 3 sets of 10 but without any weight. Consider compression sleeve if this does not cause pain. Follow up with me in about 2-3 weeks for reevaluation.

## 2016-10-31 NOTE — ED Notes (Signed)
ED Provider at bedside. 

## 2016-10-31 NOTE — ED Triage Notes (Signed)
Pt reports he was walking up a ramp yesterday and heard and felt pop in his right knee, burning pain since then. Able to wb with discomfort. Denies any other injury or c/o

## 2016-10-31 NOTE — ED Notes (Signed)
Pt directed to pharmacy to pick up Rx. Work note given 

## 2016-10-31 NOTE — Discharge Instructions (Signed)
Recommend taking tylenol  every 6 hours for pain for 1 week.
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
## 2016-11-04 DIAGNOSIS — S8991XA Unspecified injury of right lower leg, initial encounter: Secondary | ICD-10-CM | POA: Insufficient documentation

## 2016-11-04 NOTE — Assessment & Plan Note (Signed)
independently reviewed radiographs and no evidence fracture or other bony abnormalities.  Consistent with patellar tendon strain.  Ultrasound with some fluid deep to the tendon but otherwise tendon is intact with a little neovascularity at insertion.  Shown home exercises to do daily.  Icing, ibuprofen or aleve.  Crutches if needed.  F/u in 2-3 weeks.
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
## 2016-11-25 ENCOUNTER — Ambulatory Visit: Payer: Self-pay | Admitting: Family Medicine

## 2017-01-30 ENCOUNTER — Emergency Department (HOSPITAL_BASED_OUTPATIENT_CLINIC_OR_DEPARTMENT_OTHER): Payer: No Typology Code available for payment source

## 2017-01-30 ENCOUNTER — Encounter (HOSPITAL_BASED_OUTPATIENT_CLINIC_OR_DEPARTMENT_OTHER): Payer: Self-pay | Admitting: *Deleted

## 2017-01-30 ENCOUNTER — Telehealth (INDEPENDENT_AMBULATORY_CARE_PROVIDER_SITE_OTHER): Payer: Self-pay | Admitting: Orthopaedic Surgery

## 2017-01-30 ENCOUNTER — Emergency Department (HOSPITAL_BASED_OUTPATIENT_CLINIC_OR_DEPARTMENT_OTHER)
Admission: EM | Admit: 2017-01-30 | Discharge: 2017-01-30 | Disposition: A | Discharge status: Home / Self Care | Payer: No Typology Code available for payment source | Attending: Emergency Medicine | Admitting: Emergency Medicine

## 2017-01-30 DIAGNOSIS — Y999 Unspecified external cause status: Secondary | ICD-10-CM | POA: Insufficient documentation

## 2017-01-30 DIAGNOSIS — M25561 Pain in right knee: Secondary | ICD-10-CM | POA: Diagnosis not present

## 2017-01-30 DIAGNOSIS — Y9389 Activity, other specified: Secondary | ICD-10-CM | POA: Insufficient documentation

## 2017-01-30 DIAGNOSIS — Y92410 Unspecified street and highway as the place of occurrence of the external cause: Secondary | ICD-10-CM | POA: Diagnosis not present

## 2017-01-30 DIAGNOSIS — M79601 Pain in right arm: Secondary | ICD-10-CM | POA: Insufficient documentation

## 2017-01-30 MED ORDER — IBUPROFEN 800 MG PO TABS
800.0000 mg | ORAL_TABLET | Freq: Once | ORAL | Status: AC
  Administered 2017-01-30: 800 mg via ORAL
  Filled 2017-01-30: qty 1

## 2017-01-30 MED ORDER — ACETAMINOPHEN 500 MG PO TABS
1000.0000 mg | ORAL_TABLET | Freq: Once | ORAL | Status: AC
  Administered 2017-01-30: 1000 mg via ORAL
  Filled 2017-01-30: qty 2

## 2017-01-30 NOTE — Telephone Encounter (Signed)
I tried to call and the line was dead/wouldn't go through, will try to call again later.
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
## 2017-01-31 NOTE — Telephone Encounter (Signed)
IC patient and got through, he has followed up elsewhere and does not need an appt.

## 2017-03-18 ENCOUNTER — Encounter (HOSPITAL_BASED_OUTPATIENT_CLINIC_OR_DEPARTMENT_OTHER): Payer: Self-pay | Admitting: Emergency Medicine

## 2017-03-18 ENCOUNTER — Emergency Department (HOSPITAL_BASED_OUTPATIENT_CLINIC_OR_DEPARTMENT_OTHER)
Admission: EM | Admit: 2017-03-18 | Discharge: 2017-03-18 | Disposition: A | Discharge status: Home / Self Care | Payer: Self-pay | Attending: Emergency Medicine | Admitting: Emergency Medicine

## 2017-03-18 DIAGNOSIS — Z79899 Other long term (current) drug therapy: Secondary | ICD-10-CM | POA: Insufficient documentation

## 2017-03-18 DIAGNOSIS — J029 Acute pharyngitis, unspecified: Secondary | ICD-10-CM | POA: Insufficient documentation

## 2017-03-18 LAB — RAPID STREP SCREEN (MED CTR MEBANE ONLY): Streptococcus, Group A Screen (Direct): NEGATIVE

## 2017-03-18 MED ORDER — DEXAMETHASONE 10 MG/ML FOR PEDIATRIC ORAL USE
10.0000 mg | Freq: Once | INTRAMUSCULAR | Status: AC
  Administered 2017-03-18: 10 mg via ORAL
  Filled 2017-03-18: qty 1
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
## 2017-03-18 NOTE — ED Triage Notes (Signed)
Pt c/o sore throat x2 days 

## 2017-03-18 NOTE — ED Notes (Signed)
ED Provider at bedside. 

## 2017-03-20 LAB — CULTURE, GROUP A STREP (THRC)

## 2017-05-03 ENCOUNTER — Encounter (HOSPITAL_BASED_OUTPATIENT_CLINIC_OR_DEPARTMENT_OTHER): Payer: Self-pay | Admitting: Emergency Medicine

## 2017-05-03 ENCOUNTER — Emergency Department (HOSPITAL_BASED_OUTPATIENT_CLINIC_OR_DEPARTMENT_OTHER)
Admission: EM | Admit: 2017-05-03 | Discharge: 2017-05-03 | Disposition: A | Discharge status: Home / Self Care | Payer: Self-pay | Attending: Emergency Medicine | Admitting: Emergency Medicine

## 2017-05-03 DIAGNOSIS — R51 Headache: Secondary | ICD-10-CM | POA: Insufficient documentation

## 2017-05-03 DIAGNOSIS — R1110 Vomiting, unspecified: Secondary | ICD-10-CM | POA: Insufficient documentation

## 2017-05-03 DIAGNOSIS — Z5321 Procedure and treatment not carried out due to patient leaving prior to being seen by health care provider: Secondary | ICD-10-CM | POA: Insufficient documentation

## 2017-05-03 NOTE — ED Triage Notes (Signed)
PT presents with c/o headache and vomiting for a couple days.

## 2017-06-11 ENCOUNTER — Emergency Department (HOSPITAL_BASED_OUTPATIENT_CLINIC_OR_DEPARTMENT_OTHER)
Admission: EM | Admit: 2017-06-11 | Discharge: 2017-06-11 | Disposition: A | Discharge status: Home / Self Care | Payer: Self-pay | Attending: Emergency Medicine | Admitting: Emergency Medicine

## 2017-06-11 ENCOUNTER — Encounter (HOSPITAL_BASED_OUTPATIENT_CLINIC_OR_DEPARTMENT_OTHER): Payer: Self-pay | Admitting: *Deleted

## 2017-06-11 ENCOUNTER — Other Ambulatory Visit: Payer: Self-pay

## 2017-06-11 DIAGNOSIS — R079 Chest pain, unspecified: Secondary | ICD-10-CM | POA: Insufficient documentation

## 2017-06-11 DIAGNOSIS — Z5321 Procedure and treatment not carried out due to patient leaving prior to being seen by health care provider: Secondary | ICD-10-CM | POA: Insufficient documentation

## 2017-06-11 NOTE — ED Triage Notes (Signed)
Pt c/o left sided chest pain x 1 hr

## 2017-09-06 ENCOUNTER — Other Ambulatory Visit: Payer: Self-pay

## 2017-09-06 ENCOUNTER — Encounter (HOSPITAL_BASED_OUTPATIENT_CLINIC_OR_DEPARTMENT_OTHER): Payer: Self-pay | Admitting: Emergency Medicine

## 2017-09-06 ENCOUNTER — Emergency Department (HOSPITAL_BASED_OUTPATIENT_CLINIC_OR_DEPARTMENT_OTHER)
Admission: EM | Admit: 2017-09-06 | Discharge: 2017-09-06 | Disposition: A | Discharge status: Home / Self Care | Payer: Self-pay | Attending: Emergency Medicine | Admitting: Emergency Medicine

## 2017-09-06 ENCOUNTER — Emergency Department (HOSPITAL_BASED_OUTPATIENT_CLINIC_OR_DEPARTMENT_OTHER): Payer: Self-pay

## 2017-09-06 DIAGNOSIS — I517 Cardiomegaly: Secondary | ICD-10-CM | POA: Insufficient documentation

## 2017-09-06 DIAGNOSIS — Z79899 Other long term (current) drug therapy: Secondary | ICD-10-CM | POA: Insufficient documentation

## 2017-09-06 DIAGNOSIS — R0789 Other chest pain: Secondary | ICD-10-CM | POA: Insufficient documentation

## 2017-09-06 LAB — CBC
HEMATOCRIT: 42.7 % (ref 39.0–52.0)
Hemoglobin: 14.1 g/dL (ref 13.0–17.0)
MCH: 27.1 pg (ref 26.0–34.0)
MCHC: 33 g/dL (ref 30.0–36.0)
MCV: 82.1 fL (ref 78.0–100.0)
Platelets: 159 10*3/uL (ref 150–400)
RBC: 5.2 MIL/uL (ref 4.22–5.81)
RDW: 11.7 % (ref 11.5–15.5)
WBC: 3.6 10*3/uL — AB (ref 4.0–10.5)

## 2017-09-06 LAB — BASIC METABOLIC PANEL
ANION GAP: 9 (ref 5–15)
BUN: 18 mg/dL (ref 6–20)
CO2: 26 mmol/L (ref 22–32)
CREATININE: 1.13 mg/dL (ref 0.61–1.24)
Calcium: 9.5 mg/dL (ref 8.9–10.3)
Chloride: 104 mmol/L (ref 101–111)
GFR calc non Af Amer: >60 mL/min (ref >60)
Glucose, Bld: 96 mg/dL (ref 65–99)
Potassium: 4.3 mmol/L (ref 3.5–5.1)
SODIUM: 139 mmol/L (ref 135–145)

## 2017-09-06 LAB — TROPONIN I

## 2017-09-06 MED ORDER — NAPROXEN 500 MG PO TABS: 500.0000 mg | ORAL_TABLET | Freq: Two times a day (BID) | ORAL | 0 refills | Status: DC

## 2017-09-06 NOTE — ED Notes (Signed)
Pt states pain still 6/10. Pain medication offered, pt declined
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
## 2017-09-06 NOTE — ED Triage Notes (Signed)
L side chest pain x 1 hour, started while working. Denies SOB, N/V

## 2017-09-06 NOTE — Discharge Instructions (Signed)
Please read and follow all provided instructions.  Your diagnoses today include:  1. Atypical chest pain   2. Cardiomegaly    Tests performed today include:  An EKG of your heart  A chest x-ray - suggests that your heart is possibly enlarged based on the size of the heart shadow  Cardiac enzymes - a blood test for heart muscle damage  Blood counts and electrolytes  Vital signs. See below for your results today.   Medications prescribed:   Naproxen - anti-inflammatory pain medication  Do not exceed 500mg  naproxen every 12 hours, take with food  You have been prescribed an anti-inflammatory medication or NSAID. Take with food. Take smallest effective dose for the shortest duration needed for your pain. Stop taking if you experience stomach pain or vomiting.   Take any prescribed medications only as directed.  Follow-up instructions: Please follow-up with the cardiologist listed for further evaluation.   Return instructions:  SEEK IMMEDIATE MEDICAL ATTENTION IF:  You have severe chest pain, especially if the pain is crushing or pressure-like and spreads to the arms, back, neck, or jaw, or if you have sweating, nausea (feeling sick to your stomach), or shortness of breath. THIS IS AN EMERGENCY. Don't wait to see if the pain will go away. Get medical help at once. Call 911 or 0 (operator). DO NOT drive yourself to the hospital.   Your chest pain gets worse and does not go away with rest.   You have an attack of chest pain lasting longer than usual, despite rest and treatment with the medications your caregiver has prescribed.   You wake from sleep with chest pain or shortness of breath.  You feel dizzy or faint.  You have chest pain not typical of your usual pain for which you originally saw your caregiver.   You have any other emergent concerns regarding your health.  Additional Information: Chest pain comes from many different causes. Your caregiver has diagnosed you as  having chest pain that is not specific for one problem, but does not require admission.  You are at low risk for an acute heart condition or other serious illness.   Your vital signs today were: BP (!) 136/98    Pulse (!) 54    Temp 98.4 F (36.9 C) (Oral)    Resp 17    SpO2 100%  If your blood pressure (BP) was elevated above 135/85 this visit, please have this repeated by your doctor within one month. --------------

## 2017-09-08 ENCOUNTER — Emergency Department (HOSPITAL_BASED_OUTPATIENT_CLINIC_OR_DEPARTMENT_OTHER)
Admission: EM | Admit: 2017-09-08 | Discharge: 2017-09-08 | Disposition: A | Discharge status: Home / Self Care | Payer: Self-pay | Attending: Emergency Medicine | Admitting: Emergency Medicine

## 2017-09-08 ENCOUNTER — Encounter (HOSPITAL_BASED_OUTPATIENT_CLINIC_OR_DEPARTMENT_OTHER): Payer: Self-pay | Admitting: *Deleted

## 2017-09-08 ENCOUNTER — Other Ambulatory Visit: Payer: Self-pay

## 2017-09-08 DIAGNOSIS — Z5321 Procedure and treatment not carried out due to patient leaving prior to being seen by health care provider: Secondary | ICD-10-CM | POA: Insufficient documentation

## 2017-09-08 DIAGNOSIS — M549 Dorsalgia, unspecified: Secondary | ICD-10-CM | POA: Insufficient documentation

## 2017-09-08 DIAGNOSIS — R079 Chest pain, unspecified: Secondary | ICD-10-CM | POA: Insufficient documentation

## 2017-09-08 NOTE — ED Notes (Signed)
Called for pt to be roomed, unable to locate in lobby

## 2017-09-08 NOTE — ED Notes (Signed)
Pt called x 3 at different times , no answer , not found in lobby or bistro or parking lot

## 2017-09-08 NOTE — ED Triage Notes (Signed)
Pt noticed bruises on his chest yesterday and now has pain in his back.

## 2017-09-09 ENCOUNTER — Emergency Department (HOSPITAL_BASED_OUTPATIENT_CLINIC_OR_DEPARTMENT_OTHER)
Admission: EM | Admit: 2017-09-09 | Discharge: 2017-09-09 | Disposition: A | Discharge status: Home / Self Care | Payer: Self-pay | Attending: Emergency Medicine | Admitting: Emergency Medicine

## 2017-09-09 ENCOUNTER — Encounter (HOSPITAL_BASED_OUTPATIENT_CLINIC_OR_DEPARTMENT_OTHER): Payer: Self-pay

## 2017-09-09 ENCOUNTER — Other Ambulatory Visit: Payer: Self-pay

## 2017-09-09 DIAGNOSIS — R0789 Other chest pain: Secondary | ICD-10-CM | POA: Insufficient documentation

## 2017-09-09 MED ORDER — IBUPROFEN 400 MG PO TABS
600.0000 mg | ORAL_TABLET | Freq: Once | ORAL | Status: AC
  Administered 2017-09-09: 600 mg via ORAL
  Filled 2017-09-09: qty 1

## 2017-09-09 NOTE — Discharge Instructions (Signed)
It was our pleasure to provide your ER care today - we hope that you feel better.  Take motrin or aleve as need for pain.  Follow up with primary care doctor in the coming week.  Return to ER if worse, new symptoms, increased trouble breathing, other concern.
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
## 2017-10-19 ENCOUNTER — Encounter (HOSPITAL_BASED_OUTPATIENT_CLINIC_OR_DEPARTMENT_OTHER): Payer: Self-pay | Admitting: Emergency Medicine

## 2017-10-19 ENCOUNTER — Emergency Department (HOSPITAL_BASED_OUTPATIENT_CLINIC_OR_DEPARTMENT_OTHER)
Admission: EM | Admit: 2017-10-19 | Discharge: 2017-10-19 | Disposition: A | Discharge status: Home / Self Care | Payer: Self-pay | Attending: Emergency Medicine | Admitting: Emergency Medicine

## 2017-10-19 ENCOUNTER — Other Ambulatory Visit: Payer: Self-pay

## 2017-10-19 DIAGNOSIS — L089 Local infection of the skin and subcutaneous tissue, unspecified: Secondary | ICD-10-CM | POA: Insufficient documentation

## 2017-10-19 DIAGNOSIS — M79674 Pain in right toe(s): Secondary | ICD-10-CM | POA: Insufficient documentation

## 2017-10-19 NOTE — ED Triage Notes (Signed)
Patient states that he has had an ingrown toe nail to his right big toe for the last few weeks and he has tried to treat it at home. Ptient states that it now has puss in it
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
## 2017-10-19 NOTE — Discharge Instructions (Signed)
Please read instructions below. Apply a warm compress of soak your foot in warm water multiple times per day.

## 2018-02-03 ENCOUNTER — Encounter (HOSPITAL_BASED_OUTPATIENT_CLINIC_OR_DEPARTMENT_OTHER): Payer: Self-pay

## 2018-02-03 ENCOUNTER — Other Ambulatory Visit: Payer: Self-pay

## 2018-02-03 ENCOUNTER — Emergency Department (HOSPITAL_BASED_OUTPATIENT_CLINIC_OR_DEPARTMENT_OTHER)
Admission: EM | Admit: 2018-02-03 | Discharge: 2018-02-03 | Disposition: A | Discharge status: Home / Self Care | Payer: 59 | Attending: Emergency Medicine | Admitting: Emergency Medicine

## 2018-02-03 ENCOUNTER — Emergency Department (HOSPITAL_BASED_OUTPATIENT_CLINIC_OR_DEPARTMENT_OTHER): Payer: 59

## 2018-02-03 DIAGNOSIS — R109 Unspecified abdominal pain: Secondary | ICD-10-CM | POA: Diagnosis present

## 2018-02-03 LAB — URINALYSIS, ROUTINE W REFLEX MICROSCOPIC
Bilirubin Urine: NEGATIVE
Glucose, UA: NEGATIVE mg/dL
Hgb urine dipstick: NEGATIVE
Ketones, ur: NEGATIVE mg/dL
Leukocytes, UA: NEGATIVE
Nitrite: NEGATIVE
PROTEIN: NEGATIVE mg/dL
Specific Gravity, Urine: 1.025 (ref 1.005–1.030)
pH: 5.5 (ref 5.0–8.0)

## 2018-02-03 LAB — COMPREHENSIVE METABOLIC PANEL
ALBUMIN: 4.3 g/dL (ref 3.5–5.0)
ALK PHOS: 51 U/L (ref 38–126)
ALT: 30 U/L (ref 0–44)
AST: 24 U/L (ref 15–41)
Anion gap: 7 (ref 5–15)
BUN: 19 mg/dL (ref 6–20)
CALCIUM: 9.1 mg/dL (ref 8.9–10.3)
CHLORIDE: 104 mmol/L (ref 98–111)
CO2: 28 mmol/L (ref 22–32)
CREATININE: 1.01 mg/dL (ref 0.61–1.24)
GFR calc non Af Amer: >60 mL/min (ref >60)
GLUCOSE: 84 mg/dL (ref 70–99)
Potassium: 4.1 mmol/L (ref 3.5–5.1)
SODIUM: 139 mmol/L (ref 135–145)
Total Bilirubin: 0.6 mg/dL (ref 0.3–1.2)
Total Protein: 7.2 g/dL (ref 6.5–8.1)

## 2018-02-03 LAB — CBC
HCT: 41.6 % (ref 39.0–52.0)
Hemoglobin: 13.7 g/dL (ref 13.0–17.0)
MCH: 27 pg (ref 26.0–34.0)
MCHC: 32.9 g/dL (ref 30.0–36.0)
MCV: 82.1 fL (ref 78.0–100.0)
PLATELETS: 134 10*3/uL — AB (ref 150–400)
RBC: 5.07 MIL/uL (ref 4.22–5.81)
RDW: 11.9 % (ref 11.5–15.5)
WBC: 4.1 10*3/uL (ref 4.0–10.5)

## 2018-02-03 LAB — LIPASE, BLOOD: LIPASE: 24 U/L (ref 11–51)

## 2018-02-03 MED ORDER — IOPAMIDOL (ISOVUE-300) INJECTION 61%
100.0000 mL | Freq: Once | INTRAVENOUS | Status: AC | PRN
  Administered 2018-02-03: 100 mL via INTRAVENOUS

## 2018-02-03 MED ORDER — ONDANSETRON HCL 4 MG PO TABS: 4.0000 mg | ORAL_TABLET | Freq: Three times a day (TID) | ORAL | 0 refills | Status: DC | PRN

## 2018-02-03 MED ORDER — SODIUM CHLORIDE 0.9 % IV BOLUS
500.0000 mL | Freq: Once | INTRAVENOUS | Status: AC
  Administered 2018-02-03: 500 mL via INTRAVENOUS

## 2018-02-03 MED ORDER — ONDANSETRON HCL 4 MG/2ML IJ SOLN
4.0000 mg | Freq: Once | INTRAMUSCULAR | Status: AC
  Administered 2018-02-03: 4 mg via INTRAVENOUS
  Filled 2018-02-03: qty 2

## 2018-02-03 NOTE — ED Notes (Signed)
Patient transported to CT 

## 2018-02-03 NOTE — ED Triage Notes (Signed)
Pt went to [facility] d/t RLQ pain x3 days with diarrhea, sent here for CT
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
## 2018-02-03 NOTE — ED Notes (Signed)
Pt drinking contrast- appears to be in NAD.

## 2018-02-03 NOTE — Discharge Instructions (Signed)
You were given a prescription for Zofran.  Please take as prescribed.  Please follow-up with your primary care doctor in 1 week and return to the ER if you have any new or worsening symptoms in the meantime.

## 2018-02-03 NOTE — ED Notes (Signed)
Pt finished contrast. Napping on stretcher. Family at bedside.

## 2018-02-03 NOTE — ED Notes (Signed)
ED Provider at bedside. 

## 2018-04-14 ENCOUNTER — Other Ambulatory Visit: Payer: Self-pay

## 2018-04-14 ENCOUNTER — Encounter (HOSPITAL_COMMUNITY): Payer: Self-pay

## 2018-04-14 ENCOUNTER — Emergency Department (HOSPITAL_COMMUNITY)
Admission: EM | Admit: 2018-04-14 | Discharge: 2018-04-14 | Disposition: A | Discharge status: Home / Self Care | Payer: 59 | Attending: Emergency Medicine | Admitting: Emergency Medicine

## 2018-04-14 DIAGNOSIS — Z5321 Procedure and treatment not carried out due to patient leaving prior to being seen by health care provider: Secondary | ICD-10-CM | POA: Insufficient documentation

## 2018-04-14 DIAGNOSIS — R079 Chest pain, unspecified: Secondary | ICD-10-CM | POA: Insufficient documentation

## 2018-04-14 NOTE — ED Notes (Signed)
Pt refused to have labwork collected.

## 2018-04-14 NOTE — ED Triage Notes (Signed)
[facility]- pt was at work with sudden onset of left side chest pain. Pt states hx of same. HR 50-60. Skin warm and dry. No distress noted on arrival. 324mg  of aspirin given PTA.

## 2018-04-14 NOTE — ED Notes (Signed)
Pt advised staff in lobby that they were leaving. Left ama after triage

## 2018-07-24 ENCOUNTER — Emergency Department (INDEPENDENT_AMBULATORY_CARE_PROVIDER_SITE_OTHER)
Admission: EM | Admit: 2018-07-24 | Discharge: 2018-07-24 | Disposition: A | Discharge status: Home / Self Care | Payer: 59 | Source: Home / Self Care | Attending: Family Medicine | Admitting: Family Medicine

## 2018-07-24 ENCOUNTER — Other Ambulatory Visit: Payer: Self-pay

## 2018-07-24 DIAGNOSIS — R110 Nausea: Secondary | ICD-10-CM

## 2018-07-24 DIAGNOSIS — R51 Headache: Secondary | ICD-10-CM | POA: Diagnosis not present

## 2018-07-24 DIAGNOSIS — R1084 Generalized abdominal pain: Secondary | ICD-10-CM

## 2018-07-24 DIAGNOSIS — R519 Headache, unspecified: Secondary | ICD-10-CM

## 2018-07-24 MED ORDER — DEXAMETHASONE SODIUM PHOSPHATE 10 MG/ML IJ SOLN: 10.0000 mg | Freq: Once | INTRAMUSCULAR | Status: DC

## 2018-07-24 MED ORDER — METOCLOPRAMIDE HCL 5 MG/ML IJ SOLN: 5.0000 mg | Freq: Once | INTRAMUSCULAR | Status: DC

## 2018-07-24 MED ORDER — ONDANSETRON 4 MG PO TBDP
4.0000 mg | ORAL_TABLET | Freq: Once | ORAL | Status: AC
  Administered 2018-07-24: 4 mg via ORAL

## 2018-07-24 MED ORDER — KETOROLAC TROMETHAMINE 60 MG/2ML IM SOLN: 60.0000 mg | Freq: Once | INTRAMUSCULAR | Status: DC

## 2018-07-24 MED ORDER — ONDANSETRON 4 MG PO TBDP: ORAL_TABLET | ORAL | 0 refills | Status: DC

## 2018-07-24 NOTE — ED Triage Notes (Signed)
Around the 30th, started with vomiting, constant headache,frontal, Denies diarrhea
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
## 2018-07-24 NOTE — Discharge Instructions (Addendum)
May take Ibuprofen 200mg , 4 tabs every 8 hours with small amount of food.  If symptoms become significantly worse during the night or over the weekend, proceed to the local emergency room: Your headache becomes severe. You have a fever. You have a stiff neck. You have vision loss. Your muscles feel weak or like you cannot control them. You start to lose your balance often. You develop trouble walking. You faint.

## 2018-07-26 ENCOUNTER — Telehealth: Payer: Self-pay

## 2018-07-26 NOTE — Telephone Encounter (Signed)
Spoke with patient.  Pharmacy closed unable to get medication.  Went over sx that would warrant trip to ED.

## 2018-09-13 ENCOUNTER — Emergency Department (HOSPITAL_BASED_OUTPATIENT_CLINIC_OR_DEPARTMENT_OTHER)
Admission: EM | Admit: 2018-09-13 | Discharge: 2018-09-14 | Disposition: A | Discharge status: Home / Self Care | Payer: 59 | Attending: Emergency Medicine | Admitting: Emergency Medicine

## 2018-09-13 ENCOUNTER — Emergency Department (HOSPITAL_BASED_OUTPATIENT_CLINIC_OR_DEPARTMENT_OTHER): Payer: 59

## 2018-09-13 ENCOUNTER — Encounter (HOSPITAL_BASED_OUTPATIENT_CLINIC_OR_DEPARTMENT_OTHER): Payer: Self-pay | Admitting: Emergency Medicine

## 2018-09-13 ENCOUNTER — Other Ambulatory Visit: Payer: Self-pay

## 2018-09-13 DIAGNOSIS — S99911A Unspecified injury of right ankle, initial encounter: Secondary | ICD-10-CM | POA: Diagnosis not present

## 2018-09-13 DIAGNOSIS — Y998 Other external cause status: Secondary | ICD-10-CM | POA: Diagnosis not present

## 2018-09-13 DIAGNOSIS — T148XXA Other injury of unspecified body region, initial encounter: Secondary | ICD-10-CM

## 2018-09-13 DIAGNOSIS — T1490XA Injury, unspecified, initial encounter: Secondary | ICD-10-CM

## 2018-09-13 DIAGNOSIS — Y93I9 Activity, other involving external motion: Secondary | ICD-10-CM | POA: Insufficient documentation

## 2018-09-13 DIAGNOSIS — S8991XA Unspecified injury of right lower leg, initial encounter: Secondary | ICD-10-CM | POA: Diagnosis not present

## 2018-09-13 DIAGNOSIS — S20211A Contusion of right front wall of thorax, initial encounter: Secondary | ICD-10-CM | POA: Insufficient documentation

## 2018-09-13 DIAGNOSIS — M79604 Pain in right leg: Secondary | ICD-10-CM

## 2018-09-13 DIAGNOSIS — Y92410 Unspecified street and highway as the place of occurrence of the external cause: Secondary | ICD-10-CM | POA: Insufficient documentation

## 2018-09-13 DIAGNOSIS — S5011XA Contusion of right forearm, initial encounter: Secondary | ICD-10-CM | POA: Insufficient documentation

## 2018-09-13 DIAGNOSIS — S59911A Unspecified injury of right forearm, initial encounter: Secondary | ICD-10-CM | POA: Diagnosis present

## 2018-09-13 DIAGNOSIS — S79911A Unspecified injury of right hip, initial encounter: Secondary | ICD-10-CM | POA: Insufficient documentation

## 2018-09-13 MED ORDER — HYDROCODONE-ACETAMINOPHEN 5-325 MG PO TABS
2.0000 | ORAL_TABLET | Freq: Once | ORAL | Status: AC
  Administered 2018-09-13: 2 via ORAL
  Filled 2018-09-13: qty 2
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
## 2018-09-13 NOTE — ED Notes (Signed)
Patient transported to CT 
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
## 2018-09-14 MED ORDER — METHOCARBAMOL 500 MG PO TABS: 500.0000 mg | ORAL_TABLET | Freq: Two times a day (BID) | ORAL | 0 refills | Status: DC

## 2018-09-14 NOTE — Discharge Instructions (Signed)

## 2018-10-12 ENCOUNTER — Other Ambulatory Visit: Payer: Self-pay

## 2018-10-12 ENCOUNTER — Emergency Department (HOSPITAL_BASED_OUTPATIENT_CLINIC_OR_DEPARTMENT_OTHER)
Admission: EM | Admit: 2018-10-12 | Discharge: 2018-10-12 | Disposition: A | Discharge status: Home / Self Care | Payer: 59 | Attending: Emergency Medicine | Admitting: Emergency Medicine

## 2018-10-12 ENCOUNTER — Encounter (HOSPITAL_BASED_OUTPATIENT_CLINIC_OR_DEPARTMENT_OTHER): Payer: Self-pay | Admitting: Emergency Medicine

## 2018-10-12 DIAGNOSIS — Z79899 Other long term (current) drug therapy: Secondary | ICD-10-CM | POA: Diagnosis not present

## 2018-10-12 DIAGNOSIS — Z0289 Encounter for other administrative examinations: Secondary | ICD-10-CM

## 2018-10-12 DIAGNOSIS — Z0279 Encounter for issue of other medical certificate: Secondary | ICD-10-CM | POA: Diagnosis present

## 2018-10-12 NOTE — ED Triage Notes (Signed)
Reports to ER for work note.  States job wants anyone who is off for a day to be tested for coronavirus.  Explained to patient we do not test for that.  Patient states he needs note saying he can go to work.

## 2018-10-12 NOTE — Discharge Instructions (Addendum)
YOU MAY RETURN TO WORK. PLEASE STAY HOME IF YOU DEVELOP FEVER, BODY ACHES, COUGH, VOMITING, DIARRHEA, OR BODY ACHES.
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
## 2019-05-06 ENCOUNTER — Emergency Department (HOSPITAL_BASED_OUTPATIENT_CLINIC_OR_DEPARTMENT_OTHER)
Admission: EM | Admit: 2019-05-06 | Discharge: 2019-05-06 | Disposition: A | Discharge status: Home / Self Care | Payer: 59 | Attending: Emergency Medicine | Admitting: Emergency Medicine

## 2019-05-06 ENCOUNTER — Other Ambulatory Visit: Payer: Self-pay

## 2019-05-06 ENCOUNTER — Encounter (HOSPITAL_BASED_OUTPATIENT_CLINIC_OR_DEPARTMENT_OTHER): Payer: Self-pay | Admitting: *Deleted

## 2019-05-06 DIAGNOSIS — R05 Cough: Secondary | ICD-10-CM | POA: Diagnosis not present

## 2019-05-06 DIAGNOSIS — Z79899 Other long term (current) drug therapy: Secondary | ICD-10-CM | POA: Insufficient documentation

## 2019-05-06 DIAGNOSIS — R0602 Shortness of breath: Secondary | ICD-10-CM | POA: Insufficient documentation

## 2019-05-06 DIAGNOSIS — Z20828 Contact with and (suspected) exposure to other viral communicable diseases: Secondary | ICD-10-CM | POA: Diagnosis not present

## 2019-05-06 DIAGNOSIS — Z20822 Contact with and (suspected) exposure to covid-19: Secondary | ICD-10-CM

## 2019-05-06 DIAGNOSIS — R0981 Nasal congestion: Secondary | ICD-10-CM | POA: Diagnosis present

## 2019-05-06 NOTE — ED Notes (Addendum)
Pt c/o nasal congestion states "I have allergies" also reports "flashes of SOB" unable to "pinpoint a time of when" denies any at this time. Denies fever at home. States that he "has a friend that was positive" and "wants to get checked too".
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
## 2019-05-06 NOTE — ED Triage Notes (Signed)
Redness and itching to his left eye x 4 days. He used left over medication and got better. He took off work til the symptoms went away and he needs a work note.  A co worker tested positive for Covid.

## 2019-05-06 NOTE — Discharge Instructions (Signed)
Your Covid test will result within 48 hours.  Please quarantine at home until your results come back.  You can check your results on MyChart.  Drink plenty fluids and get plenty of rest.  Continue take your home medications for your symptoms.  If your test is positive you will need to quarantine at home for 14 days from when your symptoms began.  Otherwise contact your employer for further recommendations.  Return to the emergency department if any concerning signs or symptoms develop such as severe shortness of breath, high fevers, persistent vomiting, or chest pain.

## 2019-05-08 LAB — NOVEL CORONAVIRUS, NAA (HOSP ORDER, SEND-OUT TO REF LAB; TAT 18-24 HRS): SARS-CoV-2, NAA: NOT DETECTED

## 2019-05-10 ENCOUNTER — Telehealth (HOSPITAL_COMMUNITY): Payer: Self-pay

## 2019-07-07 ENCOUNTER — Encounter: Payer: Self-pay | Admitting: Allergy and Immunology

## 2019-07-07 ENCOUNTER — Other Ambulatory Visit: Payer: Self-pay

## 2019-07-07 ENCOUNTER — Ambulatory Visit: Payer: 59 | Admitting: Allergy and Immunology

## 2019-07-07 VITALS — BP 110/70 | HR 74 | Temp 97.9°F | Resp 16 | Ht 70.4 in | Wt 224.2 lb

## 2019-07-07 DIAGNOSIS — H1010 Acute atopic conjunctivitis, unspecified eye: Secondary | ICD-10-CM | POA: Insufficient documentation

## 2019-07-07 DIAGNOSIS — J3089 Other allergic rhinitis: Secondary | ICD-10-CM

## 2019-07-07 DIAGNOSIS — H1013 Acute atopic conjunctivitis, bilateral: Secondary | ICD-10-CM | POA: Diagnosis not present

## 2019-07-07 MED ORDER — OLOPATADINE HCL 0.2 % OP SOLN: OPHTHALMIC | 5 refills | Status: DC

## 2019-07-07 MED ORDER — LEVOCETIRIZINE DIHYDROCHLORIDE 5 MG PO TABS: 5.0000 mg | ORAL_TABLET | Freq: Every day | ORAL | 5 refills | Status: DC | PRN

## 2019-07-07 MED ORDER — AZELASTINE-FLUTICASONE 137-50 MCG/ACT NA SUSP: NASAL | 5 refills | Status: DC

## 2019-07-07 MED ORDER — EPINEPHRINE 0.3 MG/0.3ML IJ SOAJ: INTRAMUSCULAR | 2 refills | Status: DC

## 2019-07-07 NOTE — Patient Instructions (Addendum)
Perennial and seasonal allergic rhinitis  Aeroallergen avoidance measures have been discussed and provided in written form.  A prescription has been provided for levocetirizine(Xyzal), 5 mg daily as needed.  To avoid diminishing benefit with daily use (tachyphylaxis) of second generation antihistamine, consider alternating every few months between fexofenadine (Allegra) and levocetirizine (Xyzal).  Azelastine/fluticasone nasal spray, 1 spray per nostril twice daily if needed.  Nasal saline spray (i.e., Simply Saline) or nasal saline lavage (i.e., NeilMed) is recommended as needed and prior to medicated nasal sprays.  The risks and benefits of aeroallergen immunotherapy have been discussed. The patient is interested in the possibility of initiating immunotherapy if insurance coverage is favorable. He will let us know how he would like to proceed.  Allergic conjunctivitis  Treatment plan as outlined above for allergic rhinitis.  A prescription has been provided for Pataday, one drop per eye daily as needed.  I have also recommended eye lubricant drops (i.e., Natural Tears) as needed.   Return in about 3 months (around 10/05/2019), or if symptoms worsen or fail to improve.  Control of Dust Mite Allergen  House dust mites play a major role in allergic asthma and rhinitis.  They occur in environments with high humidity wherever human skin, the food for dust mites is found. High levels have been detected in dust obtained from mattresses, pillows, carpets, upholstered furniture, bed covers, clothes and soft toys.  The principal allergen of the house dust mite is found in its feces.  A gram of dust may contain 1,000 mites and 250,000 fecal particles.  Mite antigen is easily measured in the air during house cleaning activities.    1. Encase mattresses, including the box spring, and pillow, in an air tight cover.  Seal the zipper end of the encased mattresses with wide adhesive tape. 2. Wash the  bedding in water of 130 degrees Farenheit weekly.  Avoid cotton comforters/quilts and flannel bedding: the most ideal bed covering is the dacron comforter. 3. Remove all upholstered furniture from the bedroom. 4. Remove carpets, carpet padding, rugs, and non-washable window drapes from the bedroom.  Wash drapes weekly or use plastic window coverings. 5. Remove all non-washable stuffed toys from the bedroom.  Wash stuffed toys weekly. 6. Have the room cleaned frequently with a vacuum cleaner and a damp dust-mop.  The patient should not be in a room which is being cleaned and should wait 1 hour after cleaning before going into the room. 7. Close and seal all heating outlets in the bedroom.  Otherwise, the room will become filled with dust-laden air.  An electric heater can be used to heat the room. Reduce indoor humidity to less than 50%.  Do not use a humidifier.   Reducing Pollen Exposure  The American Academy of Allergy, Asthma and Immunology suggests the following steps to reduce your exposure to pollen during allergy seasons.    1. Do not hang sheets or clothing out to dry; pollen may collect on these items. 2. Do not mow lawns or spend time around freshly cut grass; mowing stirs up pollen. 3. Keep windows closed at night.  Keep car windows closed while driving. 4. Minimize morning activities outdoors, a time when pollen counts are usually at their highest. 5. Stay indoors as much as possible when pollen counts or humidity is high and on windy days when pollen tends to remain in the air longer. 6. Use air conditioning when possible.  Many air conditioners have filters that trap the pollen spores. 7.  Use a HEPA room air filter to remove pollen form the indoor air you breathe.   Control of Dog or Cat Allergen  Avoidance is the best way to manage a dog or cat allergy. If you have a dog or cat and are allergic to dog or cats, consider removing the dog or cat from the home. If you have a dog  or cat but don't want to find it a new home, or if your family wants a pet even though someone in the household is allergic, here are some strategies that may help keep symptoms at bay:  1. Keep the pet out of your bedroom and restrict it to only a few rooms. Be advised that keeping the dog or cat in only one room will not limit the allergens to that room. 2. Don't pet, hug or kiss the dog or cat; if you do, wash your hands with soap and water. 3. High-efficiency particulate air (HEPA) cleaners run continuously in a bedroom or living room can reduce allergen levels over time. 4. Place electrostatic material sheet in the air inlet vent in the bedroom. 5. Regular use of a high-efficiency vacuum cleaner or a central vacuum can reduce allergen levels. 6. Giving your dog or cat a bath at least once a week can reduce airborne allergen.   Control of Mold Allergen  Mold and fungi can grow on a variety of surfaces provided certain temperature and moisture conditions exist.  Outdoor molds grow on plants, decaying vegetation and soil.  The major outdoor mold, Alternaria and Cladosporium, are found in very high numbers during hot and dry conditions.  Generally, a late Summer - Fall peak is seen for common outdoor fungal spores.  Rain will temporarily lower outdoor mold spore count, but counts rise rapidly when the rainy period ends.  The most important indoor molds are Aspergillus and Penicillium.  Dark, humid and poorly ventilated basements are ideal sites for mold growth.  The next most common sites of mold growth are the bathroom and the kitchen.  Outdoor [employer] 1. Use air conditioning and keep windows closed 2. Avoid exposure to decaying vegetation. 3. Avoid leaf raking. 4. Avoid grain handling. 5. Consider wearing a face mask if working in moldy areas.  Indoor Mold Control 1. Maintain humidity below 50%. 2. Clean washable surfaces with 5% bleach solution. 3. Remove sources e.g. Contaminated  carpets.
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
## 2019-07-07 NOTE — Assessment & Plan Note (Signed)
   Aeroallergen avoidance measures have been discussed and provided in written form.  A prescription has been provided for levocetirizine(Xyzal), 5 mg daily as needed.  To avoid diminishing benefit with daily use (tachyphylaxis) of second generation antihistamine, consider alternating every few months between fexofenadine (Allegra) and levocetirizine (Xyzal).  Azelastine/fluticasone nasal spray, 1 spray per nostril twice daily if needed.  Nasal saline spray (i.e., Simply Saline) or nasal saline lavage (i.e., NeilMed) is recommended as needed and prior to medicated nasal sprays.  The risks and benefits of aeroallergen immunotherapy have been discussed. The patient is interested in the possibility of initiating immunotherapy if insurance coverage is favorable. He will let us know how he would like to proceed.

## 2019-07-07 NOTE — Assessment & Plan Note (Signed)
   Treatment plan as outlined above for allergic rhinitis.  A prescription has been provided for Pataday, one drop per eye daily as needed.  I have also recommended eye lubricant drops (i.e., Natural Tears) as needed. 

## 2019-07-08 DIAGNOSIS — J3089 Other allergic rhinitis: Secondary | ICD-10-CM

## 2019-07-08 NOTE — Progress Notes (Signed)
VIALS EXP 07-07-20 

## 2019-07-12 DIAGNOSIS — J3081 Allergic rhinitis due to animal (cat) (dog) hair and dander: Secondary | ICD-10-CM

## 2019-07-29 ENCOUNTER — Ambulatory Visit: Payer: 59

## 2019-08-20 ENCOUNTER — Emergency Department (HOSPITAL_BASED_OUTPATIENT_CLINIC_OR_DEPARTMENT_OTHER)
Admission: EM | Admit: 2019-08-20 | Discharge: 2019-08-20 | Disposition: A | Discharge status: Home / Self Care | Payer: 59 | Attending: Emergency Medicine | Admitting: Emergency Medicine

## 2019-08-20 ENCOUNTER — Emergency Department (HOSPITAL_BASED_OUTPATIENT_CLINIC_OR_DEPARTMENT_OTHER): Payer: 59

## 2019-08-20 ENCOUNTER — Encounter (HOSPITAL_BASED_OUTPATIENT_CLINIC_OR_DEPARTMENT_OTHER): Payer: Self-pay | Admitting: Emergency Medicine

## 2019-08-20 ENCOUNTER — Other Ambulatory Visit: Payer: Self-pay

## 2019-08-20 DIAGNOSIS — J129 Viral pneumonia, unspecified: Secondary | ICD-10-CM | POA: Diagnosis not present

## 2019-08-20 DIAGNOSIS — R456 Violent behavior: Secondary | ICD-10-CM | POA: Diagnosis not present

## 2019-08-20 DIAGNOSIS — R05 Cough: Secondary | ICD-10-CM | POA: Diagnosis present

## 2019-08-20 DIAGNOSIS — R059 Cough, unspecified: Secondary | ICD-10-CM

## 2019-08-20 DIAGNOSIS — R4689 Other symptoms and signs involving appearance and behavior: Secondary | ICD-10-CM

## 2019-08-20 DIAGNOSIS — Z20822 Contact with and (suspected) exposure to covid-19: Secondary | ICD-10-CM

## 2019-08-20 MED ORDER — ALBUTEROL SULFATE HFA 108 (90 BASE) MCG/ACT IN AERS: 1.0000 | INHALATION_SPRAY | Freq: Four times a day (QID) | RESPIRATORY_TRACT | 0 refills | Status: DC | PRN

## 2019-08-20 NOTE — ED Notes (Signed)
Patient attempted to record conversation with provider and this nurse. Patient informed that this is a violation of [facility] policy and informed he needed to stop. Upon discharge patient became belligerent with provider and this nurse when informed of diagnosis. When this nurse attempted to explain rational for provider diagnosis patient was again verbally belligerent and stormed off stating he would file a complaint. Provider and charge informed.

## 2019-08-20 NOTE — Discharge Instructions (Signed)
Person Under Monitoring Name: Jimmy Mcdaniel  Location: [address], [address] [place] [address]   Infection Prevention Recommendations for Individuals Confirmed to have, or Being Evaluated for, 2019 Novel Coronavirus (COVID-19) Infection Who Receive Care at Home  Individuals who are confirmed to have, or are being evaluated for, COVID-19 should follow the prevention steps below until a healthcare provider or local or state health department says they can return to normal activities.  Stay home except to get medical care You should restrict activities outside your home, except for getting medical care. Do not go to work, school, or public areas, and do not use public transportation or taxis.  Call ahead before visiting your doctor Before your medical appointment, call the healthcare provider and tell them that you have, or are being evaluated for, COVID-19 infection. This will help the healthcare provider's office take steps to keep other people from getting infected. Ask your healthcare provider to call the local or state health department.  Monitor your symptoms Seek prompt medical attention if your illness is worsening (e.g., difficulty breathing). Before going to your medical appointment, call the healthcare provider and tell them that you have, or are being evaluated for, COVID-19 infection. Ask your healthcare provider to call the local or state health department.  Wear a facemask You should wear a facemask that covers your nose and mouth when you are in the same room with other people and when you visit a healthcare provider. People who live with or visit you should also wear a facemask while they are in the same room with you.  Separate yourself from other people in your home As much as possible, you should stay in a different room from other people in your home. Also, you should use a separate bathroom, if available.  Avoid sharing household items You  should not share dishes, drinking glasses, cups, eating utensils, towels, bedding, or other items with other people in your home. After using these items, you should wash them thoroughly with soap and water.  Cover your coughs and sneezes Cover your mouth and nose with a tissue when you cough or sneeze, or you can cough or sneeze into your sleeve. Throw used tissues in a lined trash can, and immediately wash your hands with soap and water for at least 20 seconds or use an alcohol-based hand rub.  Wash your [employer] your hands often and thoroughly with soap and water for at least 20 seconds. You can use an alcohol-based hand sanitizer if soap and water are not available and if your hands are not visibly dirty. Avoid touching your eyes, nose, and mouth with unwashed hands.   Prevention Steps for Caregivers and Household Members of Individuals Confirmed to have, or Being Evaluated for, COVID-19 Infection Being Cared for in the Home  If you live with, or provide care at home for, a person confirmed to have, or being evaluated for, COVID-19 infection please follow these guidelines to prevent infection:  Follow healthcare provider's instructions Make sure that you understand and can help the patient follow any healthcare provider instructions for all care.  Provide for the patient's basic needs You should help the patient with basic needs in the home and provide support for getting groceries, prescriptions, and other personal needs.  Monitor the patient's symptoms If they are getting sicker, call his or her medical provider and tell them that the patient has, or is being evaluated for, COVID-19 infection. This will help the healthcare  provider's office take steps to keep other people from getting infected. Ask the healthcare provider to call the local or state health department.  Limit the number of people who have contact with the patient If possible, have only one caregiver for the  patient. Other household members should stay in another home or place of residence. If this is not possible, they should stay in another room, or be separated from the patient as much as possible. Use a separate bathroom, if available. Restrict visitors who do not have an essential need to be in the home.  Keep older adults, very young children, and other sick people away from the patient Keep older adults, very young children, and those who have compromised immune systems or chronic health conditions away from the patient. This includes people with chronic heart, lung, or kidney conditions, diabetes, and cancer.  Ensure good ventilation Make sure that shared spaces in the home have good air flow, such as from an air conditioner or an opened window, weather permitting.  Wash your hands often Wash your hands often and thoroughly with soap and water for at least 20 seconds. You can use an alcohol based hand sanitizer if soap and water are not available and if your hands are not visibly dirty. Avoid touching your eyes, nose, and mouth with unwashed hands. Use disposable paper towels to dry your hands. If not available, use dedicated cloth towels and replace them when they become wet.  Wear a facemask and gloves Wear a disposable facemask at all times in the room and gloves when you touch or have contact with the patient's blood, body fluids, and/or secretions or excretions, such as sweat, saliva, sputum, nasal mucus, vomit, urine, or feces.  Ensure the mask fits over your nose and mouth tightly, and do not touch it during use. Throw out disposable facemasks and gloves after using them. Do not reuse. Wash your hands immediately after removing your facemask and gloves. If your personal clothing becomes contaminated, carefully remove clothing and launder. Wash your hands after handling contaminated clothing. Place all used disposable facemasks, gloves, and other waste in a lined container before  disposing them with other household waste. Remove gloves and wash your hands immediately after handling these items.  Do not share dishes, glasses, or other household items with the patient Avoid sharing household items. You should not share dishes, drinking glasses, cups, eating utensils, towels, bedding, or other items with a patient who is confirmed to have, or being evaluated for, COVID-19 infection. After the person uses these items, you should wash them thoroughly with soap and water.  Wash laundry thoroughly Immediately remove and wash clothes or bedding that have blood, body fluids, and/or secretions or excretions, such as sweat, saliva, sputum, nasal mucus, vomit, urine, or feces, on them. Wear gloves when handling laundry from the patient. Read and follow directions on labels of laundry or clothing items and detergent. In general, wash and dry with the warmest temperatures recommended on the label.  Clean all areas the individual has used often Clean all touchable surfaces, such as counters, tabletops, doorknobs, bathroom fixtures, toilets, phones, keyboards, tablets, and bedside tables, every day. Also, clean any surfaces that may have blood, body fluids, and/or secretions or excretions on them. Wear gloves when cleaning surfaces the patient has come in contact with. Use a diluted bleach solution (e.g., dilute bleach with 1 part bleach and 10 parts water) or a household disinfectant with a label that says EPA-registered for coronaviruses. To make  a bleach solution at home, add 1 tablespoon of bleach to 1 quart (4 cups) of water. For a larger supply, add  cup of bleach to 1 gallon (16 cups) of water. Read labels of cleaning products and follow recommendations provided on product labels. Labels contain instructions for safe and effective use of the cleaning product including precautions you should take when applying the product, such as wearing gloves or eye protection and making sure you  have good ventilation during use of the product. Remove gloves and wash hands immediately after cleaning.  Monitor yourself for signs and symptoms of illness Caregivers and household members are considered close contacts, should monitor their health, and will be asked to limit movement outside of the home to the extent possible. Follow the monitoring steps for close contacts listed on the symptom monitoring form.   ? If you have additional questions, contact your local health department or call the epidemiologist on call at [phone] (available 24/7). ? This guidance is subject to change. For the most up-to-date guidance from [facility], please refer to their website: [email or web address]

## 2019-08-20 NOTE — ED Notes (Signed)
Patient refused Covid Testing as ordered by the provider.

## 2019-08-20 NOTE — ED Notes (Signed)
Patient returned to ED front desk; patient requesting a note for work. Gave patient a work excuse and discussed patient's concerns and answered his questions regarding his care. Explained to patient that a viral pneumonia likely caused by covid is not treated the way a bacterial pneumonia is treated. Discussed patient taking zinc, vitamin C, tylenol and use prescribed inhaler for sx. Discussed need to quarantine for 2 weeks after onset of sx. Advised patient to return for any shortness of breath. Patient and family member verbalized understanding. No further questions or concerns voiced.

## 2019-08-20 NOTE — ED Triage Notes (Signed)
Patient presents with complaints of cough x 1 week; denies bodyaches; denies fever; states some congestion; states used a nebulizer which gave some relief. NAD noted
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
## 2019-10-18 ENCOUNTER — Emergency Department (HOSPITAL_COMMUNITY)
Admission: EM | Admit: 2019-10-18 | Discharge: 2019-10-18 | Disposition: A | Discharge status: Home / Self Care | Payer: 59 | Attending: Emergency Medicine | Admitting: Emergency Medicine

## 2019-10-18 ENCOUNTER — Telehealth: Payer: Self-pay | Admitting: Cardiology

## 2019-10-18 ENCOUNTER — Emergency Department (HOSPITAL_COMMUNITY): Payer: 59

## 2019-10-18 ENCOUNTER — Other Ambulatory Visit: Payer: Self-pay

## 2019-10-18 ENCOUNTER — Encounter (HOSPITAL_COMMUNITY): Payer: Self-pay

## 2019-10-18 DIAGNOSIS — J45909 Unspecified asthma, uncomplicated: Secondary | ICD-10-CM | POA: Diagnosis not present

## 2019-10-18 DIAGNOSIS — R0789 Other chest pain: Secondary | ICD-10-CM

## 2019-10-18 LAB — BASIC METABOLIC PANEL
Anion gap: 7 (ref 5–15)
BUN: 17 mg/dL (ref 6–20)
CO2: 28 mmol/L (ref 22–32)
Calcium: 9.1 mg/dL (ref 8.9–10.3)
Chloride: 105 mmol/L (ref 98–111)
Creatinine, Ser: 1.14 mg/dL (ref 0.61–1.24)
GFR calc Af Amer: >60 mL/min (ref >60)
GFR calc non Af Amer: >60 mL/min (ref >60)
Glucose, Bld: 99 mg/dL (ref 70–99)
Potassium: 4.1 mmol/L (ref 3.5–5.1)
Sodium: 140 mmol/L (ref 135–145)

## 2019-10-18 LAB — CBC
HCT: 45.4 % (ref 39.0–52.0)
Hemoglobin: 14.2 g/dL (ref 13.0–17.0)
MCH: 26.8 pg (ref 26.0–34.0)
MCHC: 31.3 g/dL (ref 30.0–36.0)
MCV: 85.8 fL (ref 80.0–100.0)
Platelets: 147 10*3/uL — ABNORMAL LOW (ref 150–400)
RBC: 5.29 MIL/uL (ref 4.22–5.81)
RDW: 11.9 % (ref 11.5–15.5)
WBC: 7 10*3/uL (ref 4.0–10.5)
nRBC: 0 % (ref 0.0–0.2)

## 2019-10-18 LAB — TROPONIN I (HIGH SENSITIVITY)
Troponin I (High Sensitivity): <2 ng/L (ref <18)
Troponin I (High Sensitivity): <2 ng/L (ref <18)

## 2019-10-18 LAB — D-DIMER, QUANTITATIVE: D-Dimer, Quant: <0.27 ug/mL-FEU (ref 0.00–0.50)

## 2019-10-18 MED ORDER — SODIUM CHLORIDE 0.9% FLUSH
3.0000 mL | Freq: Once | INTRAVENOUS | Status: AC
  Administered 2019-10-18: 10:00:00 3 mL via INTRAVENOUS

## 2019-10-18 NOTE — ED Notes (Signed)
Pt transported to xray 
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
## 2019-10-18 NOTE — Discharge Instructions (Addendum)
All the testing today is normal.  Your pain is likely related to the bones or muscles of the chest wall.  The best treatment for this type of pain is to use a heating pad on it 3-4 times a day, and use Tylenol for pain.  If you continue to have discomfort, follow-up with a primary care doctor for further evaluation.

## 2019-10-18 NOTE — Telephone Encounter (Signed)
    I went in the patient chart , because pt wanted a new pt's appt. Pt said [facility] and was told to follow up with a Cardiologist.t

## 2019-10-18 NOTE — ED Triage Notes (Signed)
Patient c/o left chest pain that started 2 days ago. Patient states breathing and moving makes the pain worse.
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
## 2019-11-02 ENCOUNTER — Ambulatory Visit: Payer: 59 | Admitting: Cardiology

## 2020-01-02 IMAGING — CT CT ABD-PELV W/ CM
2 of 4 series · 17 of 46 positions shown, 19 images · IV contrast (APPLIED)
Comparison: None.

CLINICAL DATA: Abdominal pain right lower quadrant. Rule out
appendicitis

EXAM:
CT ABDOMEN AND PELVIS WITH CONTRAST
TECHNIQUE: Multidetector CT imaging of the abdomen and pelvis was performed
using the standard protocol following bolus administration of
intravenous contrast.
CONTRAST:  100mL TRSYEX-UAA IOPAMIDOL (TRSYEX-UAA) INJECTION 61%

[Series 2: axial st · axial · 0.92mm/px · z∈[-575,-90]mm · 14 of 107 slices shown, 16 images]
[im 5/107  soft-tissue]
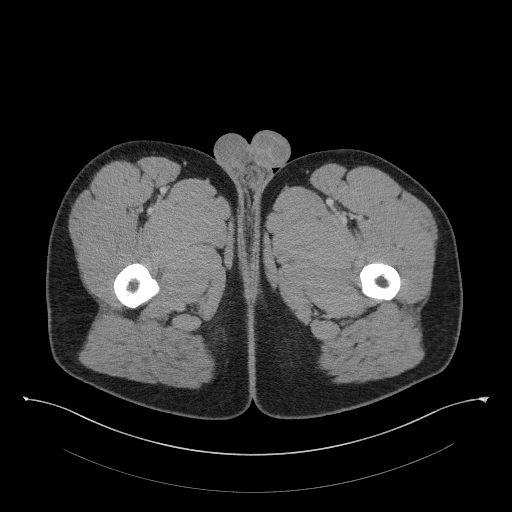
[im 5/107  bone]
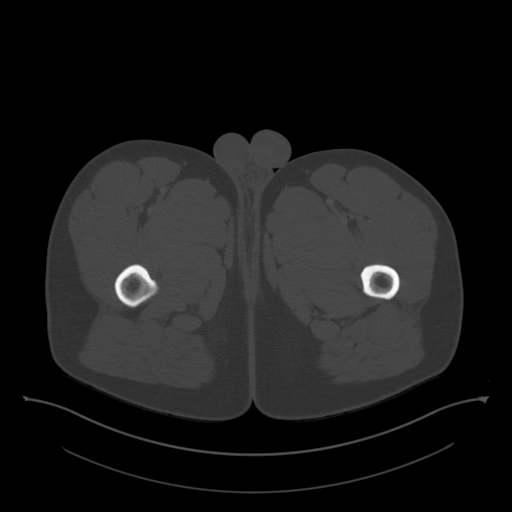
[im 14/107  soft-tissue]
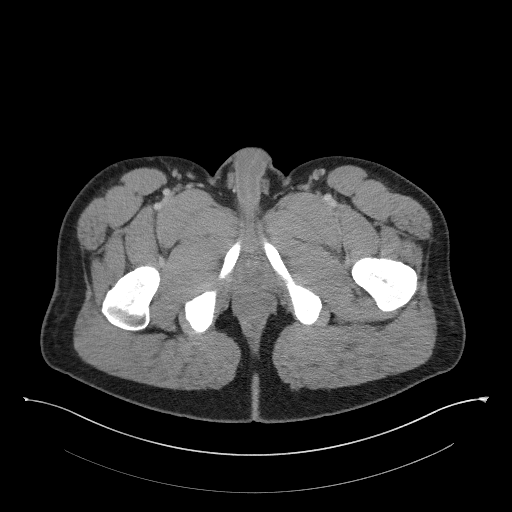
[im 23/107  soft-tissue]
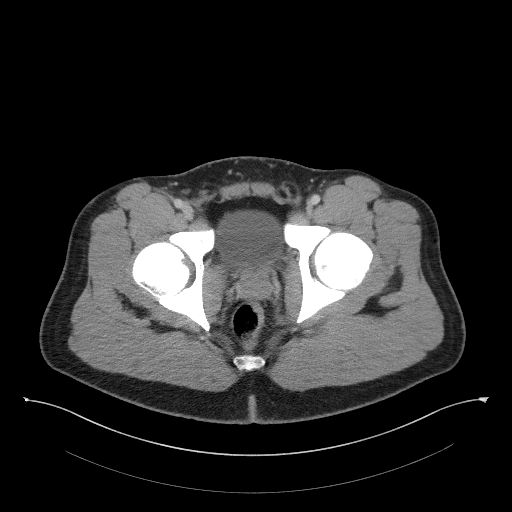
[im 27/107  soft-tissue]
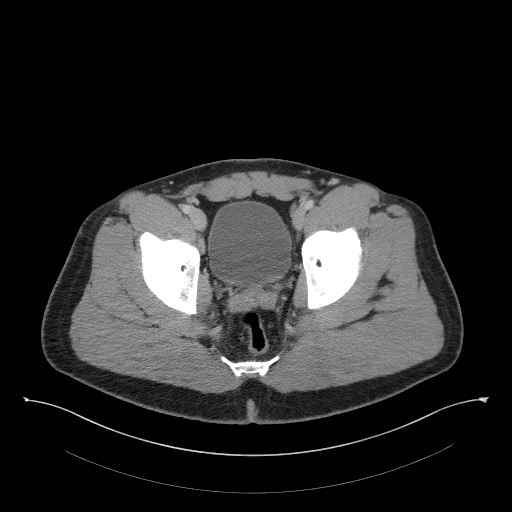
[im 36/107  soft-tissue]
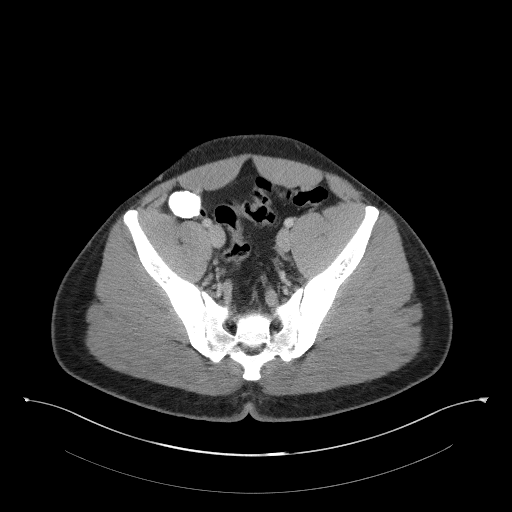
[im 45/107  soft-tissue]
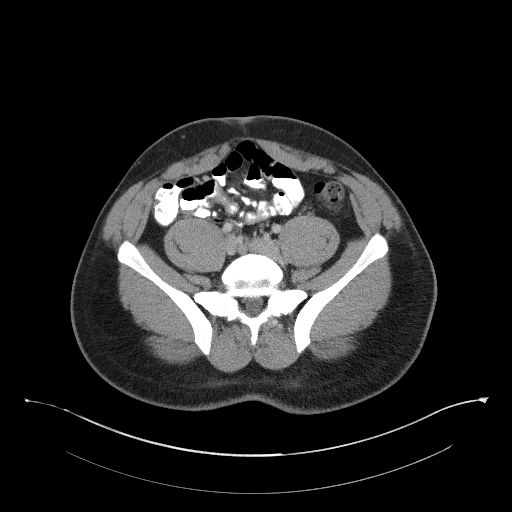
[im 49/107  soft-tissue]
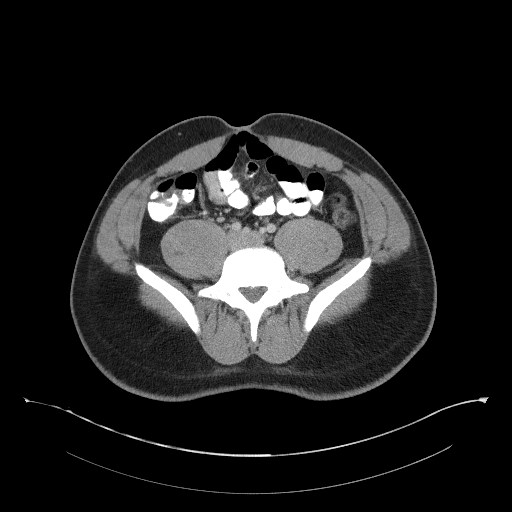
[im 58/107  soft-tissue]
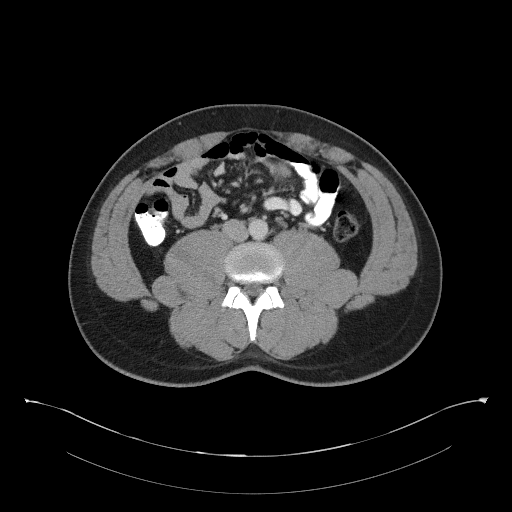
[im 62/107  soft-tissue]
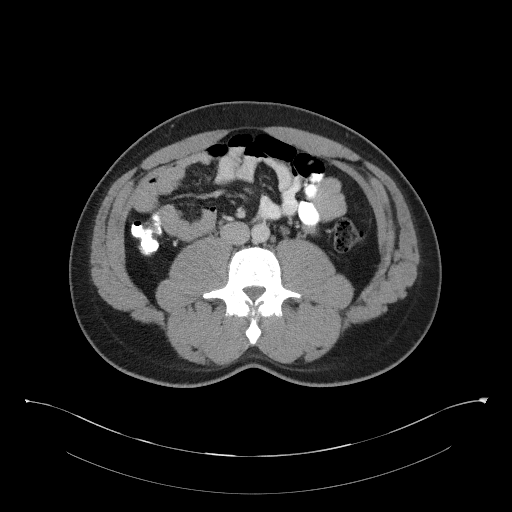
[im 62/107  bone]
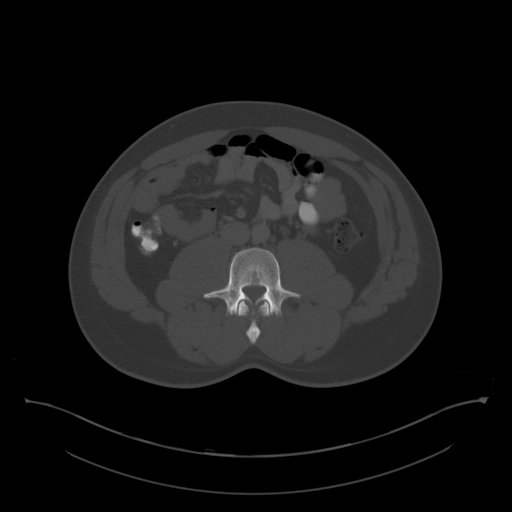
[im 71/107  soft-tissue]
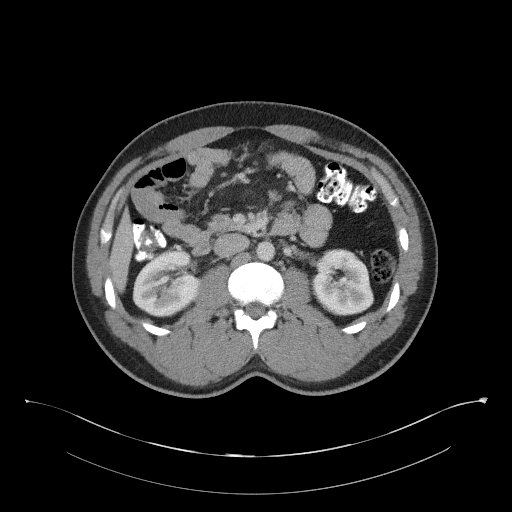
[im 80/107  soft-tissue]
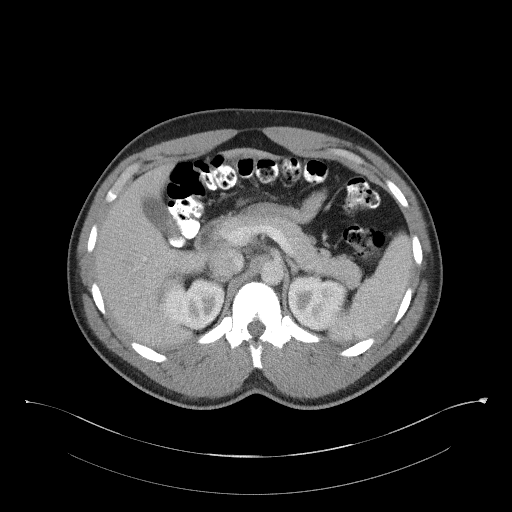
[im 84/107  soft-tissue]
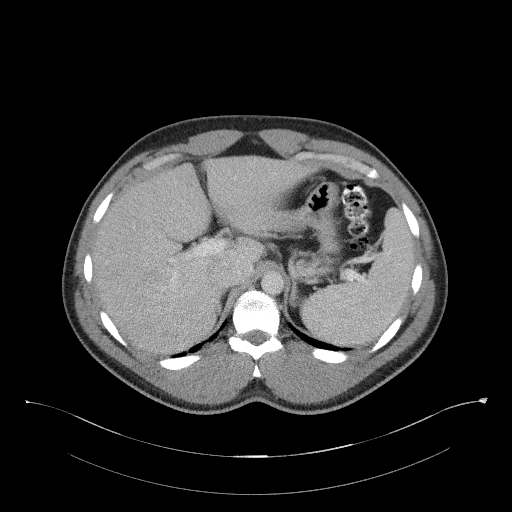
[im 93/107  soft-tissue]
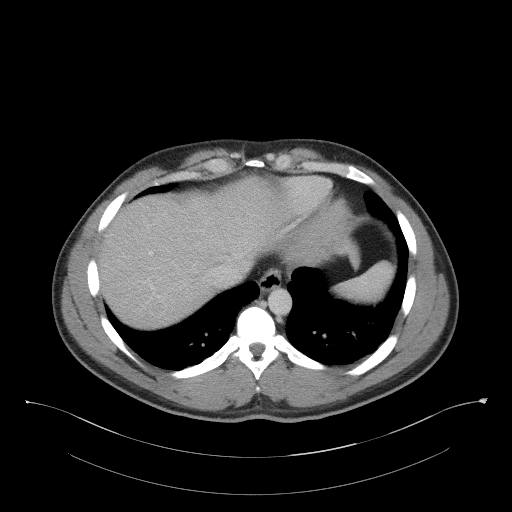
[im 102/107  soft-tissue]
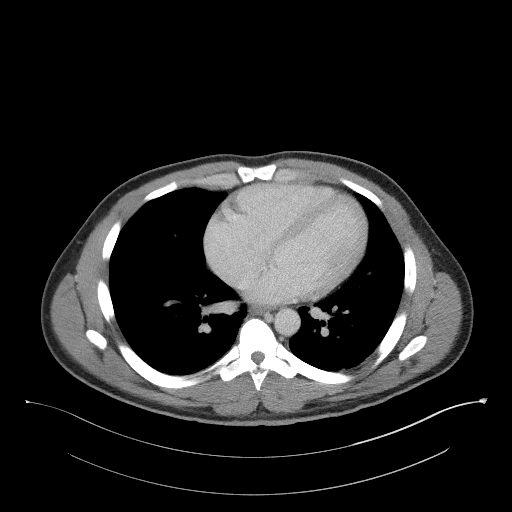

[Series 5: coronal st · coronal · 0.87mm/px · 3 of 92 slices shown]
[im 31/92  soft-tissue]
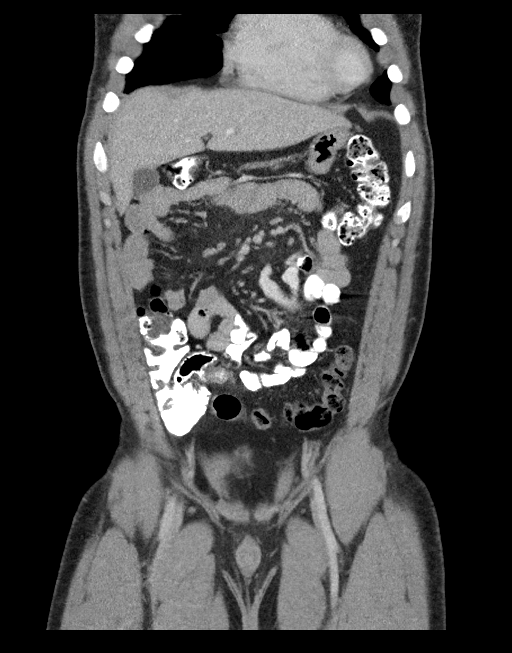
[im 41/92  soft-tissue]
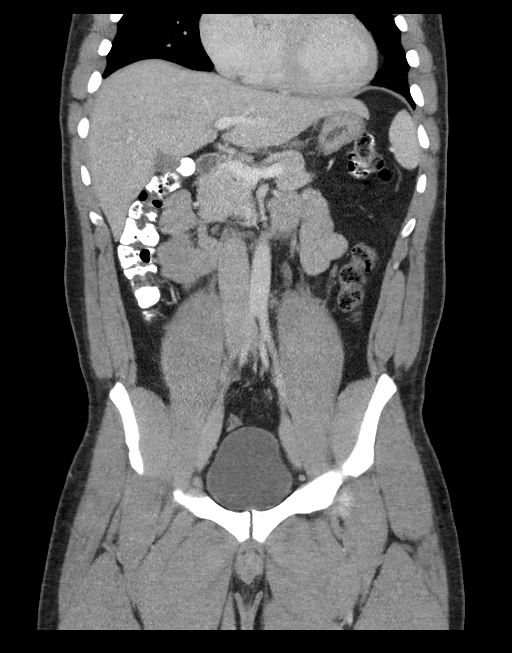
[im 51/92  soft-tissue]
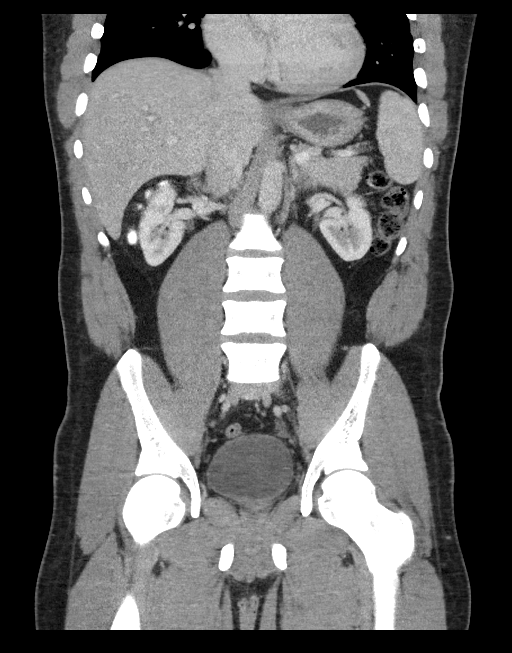

[17 of 46 positions shown; findings below may reference images not displayed]

FINDINGS: Lower chest: Lung bases clear.

Hepatobiliary: Normal liver.  Gallbladder and bile ducts normal.

Pancreas: Negative

Spleen: Negative

Adrenals/Urinary Tract: Normal kidneys ureter and bladder. No renal
calculi or hydronephrosis

Stomach/Bowel: Negative for bowel obstruction. Negative for bowel
mass or edema. Normal appendix. No evidence of colitis

Vascular/Lymphatic: Negative

Reproductive: Normal prostate

Other: Negative for free fluid.  No hernia

Musculoskeletal: Negative
IMPRESSION: Normal CT abdomen pelvis.  Normal appendix.

## 2020-05-16 NOTE — Progress Notes (Addendum)
VIALS NOT MADE APPT WAS CANCELED.

## 2020-05-23 ENCOUNTER — Ambulatory Visit: Payer: 59 | Admitting: Family

## 2020-05-24 NOTE — Patient Instructions (Addendum)
Seasonal and perennial allergic rhinitis Continue Xyzal, Allegra, Zyrtec, or Claritin once a day as needed for runny nose. Consider saline nasal spray or saline nasal rinse to help with nasal symptoms. Use this prior to any medicated nasal sprays.  Consider allergy injections as a means of long-term control. - Allergy injections "re-train" and "reset" the immune system to ignore environmental allergens and decrease the resulting immune response to those allergens (sneezing, itchy watery eyes, runny nose, nasal congestion, etc).    - Allergy injections improve symptoms in 75-85% of patients.   - We can discuss this more at the next appointment if the medications are not working for you.  Allergic conjunctivitis Continue Pataday 1 drop each eye once a day as needed for red, itchy, watery eyes May use Natural Tears - eye lubricant drop as needed  Please let us know if this treatment plan is not working well for you Schedule follow-up appointment in 3 months

## 2020-05-25 ENCOUNTER — Ambulatory Visit (INDEPENDENT_AMBULATORY_CARE_PROVIDER_SITE_OTHER): Payer: 59 | Admitting: Family

## 2020-05-25 ENCOUNTER — Encounter: Payer: Self-pay | Admitting: Family

## 2020-05-25 ENCOUNTER — Other Ambulatory Visit: Payer: Self-pay

## 2020-05-25 VITALS — BP 112/82 | HR 75 | Temp 97.1°F | Resp 18 | Ht 70.0 in | Wt 216.8 lb

## 2020-05-25 DIAGNOSIS — J3089 Other allergic rhinitis: Secondary | ICD-10-CM | POA: Diagnosis not present

## 2020-05-25 DIAGNOSIS — H1013 Acute atopic conjunctivitis, bilateral: Secondary | ICD-10-CM | POA: Diagnosis not present

## 2020-05-25 NOTE — Progress Notes (Addendum)
[address] [place] [address] Dept: [phone]  FOLLOW UP NOTE  Patient ID: Jimmy Mcdaniel, male    DOB: 08-21-1990  Age: 29 y.o. MRN: [id number] Date of Office Visit: 05/25/2020  Assessment  Chief Complaint: Allergies  HPI Jimmy Mcdaniel is a 29 year old male who presents today for follow-up of perennial and seasonal allergic rhinitis and allergic conjunctivitis.  He was last seen on July 07, 2019 by Dr. Nunzio Cobbs.  Perennial and seasonal allergic rhinitis is reported not well controlled with montelukast once a day and an" allergy pil".He reports that he rotates antihistamines.  He is not able to tolerate nasal sprays due to nosebleeds.  Discussed proper technique of nasal sprays and he reports that he has used nasal sprays every way.  He reports nasal congestion and denies postnasal drip and itchy watery eyes.  He is interested in starting allergy injections.  He reports a history of asthma and is currently on montelukast and albuterol.  His asthma is managed by his primary care physician and he is not interested in doing spirometry today.  He denies any coughing, wheezing, tightness in chest, and shortness of breath.  He is currently not having to use his albuterol inhaler.  Current medications are as listed in the chart.   Drug Allergies:  No Known Allergies  Review of Systems: Review of Systems  Constitutional: Negative for chills and fever.  HENT:       Reports nasal congestion and denies rhinorrhea and post nasal drip  Eyes:       Denies itchy watery eyes  Respiratory: Negative for cough, shortness of breath and wheezing.   Cardiovascular: Negative for chest pain and palpitations.  Gastrointestinal: Negative for heartburn.  Genitourinary: Negative for dysuria.  Skin: Negative for itching and rash.  Neurological: Negative for headaches.  Endo/Heme/Allergies: Negative for environmental allergies.    Physical Exam: BP 112/82   Pulse 75   Temp (!) 97.1 F  (36.2 C) (Tympanic)   Resp 18   Ht 5\' 10"  (1.778 m)   Wt 216 lb 12.8 oz (98.3 kg)   SpO2 99%   BMI 31.11 kg/m    Physical Exam Constitutional:      Appearance: Normal appearance.  HENT:     Head: Normocephalic and atraumatic.     Comments: Pharynx normal. Eyes normal. Ears normal. Nose: moderately edematous and slightly erythematous with no drainage noted    Right Ear: Tympanic membrane, ear canal and external ear normal.     Left Ear: Tympanic membrane, ear canal and external ear normal.     Mouth/Throat:     Mouth: Mucous membranes are moist.     Pharynx: Oropharynx is clear.  Eyes:     Conjunctiva/sclera: Conjunctivae normal.  Cardiovascular:     Rate and Rhythm: Regular rhythm.     Heart sounds: Normal heart sounds.  Pulmonary:     Effort: Pulmonary effort is normal.     Breath sounds: Normal breath sounds.     Comments: Lungs clear to auscultation- He would not take  deep breaths when asked Skin:    General: Skin is warm.  Neurological:     Mental Status: He is alert and oriented to person, place, and time.  Psychiatric:        Mood and Affect: Mood normal.        Thought Content: Thought content normal.        Judgment: Judgment normal.     Diagnostics:  None  Assessment and  Plan: 1. Perennial and seasonal allergic rhinitis   2. Allergic conjunctivitis of both eyes     No orders of the defined types were placed in this encounter.   Patient Instructions  Seasonal and perennial allergic rhinitis Continue Xyzal, Allegra, Zyrtec, or Claritin once a day as needed for runny nose. Consider saline nasal spray or saline nasal rinse to help with nasal symptoms. Use this prior to any medicated nasal sprays.  Consider allergy injections as a means of long-term control. - Allergy injections "re-train" and "reset" the immune system to ignore environmental allergens and decrease the resulting immune response to those allergens (sneezing, itchy watery eyes, runny nose,  nasal congestion, etc).    - Allergy injections improve symptoms in 75-85% of patients.   - We can discuss this more at the next appointment if the medications are not working for you.  Allergic conjunctivitis Continue Pataday 1 drop each eye once a day as needed for red, itchy, watery eyes May use Natural Tears - eye lubricant drop as needed  Please let us know if this treatment plan is not working well for you Schedule follow-up appointment in 3 months    Return in about 3 months (around 08/25/2020), or if symptoms worsen or fail to improve.    Thank you for the opportunity to care for this patient.  Please do not hesitate to contact me with questions.  Nehemiah Settle, FNP [facility]   ________________________________________________  I have provided oversight concerning Wynona Canes Teshara Moree's evaluation and treatment of this patient's health issues addressed during today's encounter.  I agree with the assessment and therapeutic plan as outlined in the note.   Signed,   R Jorene Guest, MD

## 2020-05-30 ENCOUNTER — Ambulatory Visit: Payer: 59

## 2021-06-16 ENCOUNTER — Emergency Department
Admission: EM | Admit: 2021-06-16 | Discharge: 2021-06-16 | Disposition: A | Discharge status: Home / Self Care | Payer: 59 | Attending: Emergency Medicine | Admitting: Emergency Medicine

## 2021-06-16 ENCOUNTER — Emergency Department: Payer: 59

## 2021-06-16 ENCOUNTER — Other Ambulatory Visit: Payer: Self-pay

## 2021-06-16 DIAGNOSIS — J101 Influenza due to other identified influenza virus with other respiratory manifestations: Secondary | ICD-10-CM

## 2021-06-16 DIAGNOSIS — Z20822 Contact with and (suspected) exposure to covid-19: Secondary | ICD-10-CM | POA: Diagnosis not present

## 2021-06-16 DIAGNOSIS — R059 Cough, unspecified: Secondary | ICD-10-CM | POA: Diagnosis present

## 2021-06-16 DIAGNOSIS — R000 Tachycardia, unspecified: Secondary | ICD-10-CM | POA: Insufficient documentation

## 2021-06-16 LAB — RESP PANEL BY RT-PCR (FLU A&B, COVID) ARPGX2
Influenza A by PCR: POSITIVE — AB
Influenza B by PCR: NEGATIVE
SARS Coronavirus 2 by RT PCR: NEGATIVE

## 2021-06-16 MED ORDER — FLUTICASONE PROPIONATE 50 MCG/ACT NA SUSP: 1.0000 | Freq: Every day | NASAL | 0 refills | Status: DC

## 2021-06-16 MED ORDER — ONDANSETRON HCL 4 MG PO TABS: 4.0000 mg | ORAL_TABLET | Freq: Three times a day (TID) | ORAL | 0 refills | Status: DC | PRN

## 2021-06-16 MED ORDER — ONDANSETRON 4 MG PO TBDP
4.0000 mg | ORAL_TABLET | Freq: Once | ORAL | Status: AC
  Administered 2021-06-16: 4 mg via ORAL
  Filled 2021-06-16: qty 1

## 2021-06-16 MED ORDER — IBUPROFEN 400 MG PO TABS
400.0000 mg | ORAL_TABLET | Freq: Once | ORAL | Status: AC
  Administered 2021-06-16: 400 mg via ORAL
  Filled 2021-06-16: qty 1

## 2021-06-16 MED ORDER — BENZONATATE 100 MG PO CAPS: 100.0000 mg | ORAL_CAPSULE | Freq: Three times a day (TID) | ORAL | 0 refills | Status: AC | PRN

## 2021-06-16 MED ORDER — BENZONATATE 100 MG PO CAPS: 100.0000 mg | ORAL_CAPSULE | Freq: Three times a day (TID) | ORAL | 0 refills | Status: DC | PRN

## 2021-06-16 NOTE — ED Provider Notes (Signed)
Emergency Medicine Provider Triage Evaluation Note  Jimmy Mcdaniel , a 30 y.o. male  was evaluated in triage.  Pt complains of cough, fever, congestion, and body aches since Wednesday.  He noted a T-max last night 103 F.  He been taking Tylenol and Motrin as well as NyQuil for symptom relief.  Review of Systems  Positive: FCS, cough, body aches Negative: NVD  Physical Exam  BP (!) 126/95 (BP Location: Left Arm)   Pulse (!) 106   Temp 99.8 F (37.7 C) (Oral)   Resp 20   SpO2 100%  Gen:   Awake, no distress  NAD Resp:  Normal effort CTA MSK:   Moves extremities without difficulty  Other:  CVS: RRR  Medical Decision Making  Medically screening exam initiated at 2:56 PM.  Appropriate orders placed.  Jimmy Mcdaniel was informed that the remainder of the evaluation will be completed by another provider, this initial triage assessment does not replace that evaluation, and the importance of remaining in the ED until their evaluation is complete.  Patient ED evaluation of flulike symptoms including fevers, body aches, cough and congestion.   Lissa Hoard, PA-C 06/16/21 1457    Delton Prairie, MD 06/17/21 [phone]
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
## 2021-06-16 NOTE — ED Triage Notes (Signed)
Pt via POV from home. Pt c/o cough, nasal congestion, fever, and headache since Wednesday. Pt states his fever has been 103 last night. 0830 pt took Ibuprofen. Pt is A&OX4 and NAD.

## 2021-08-28 ENCOUNTER — Ambulatory Visit: Payer: Self-pay | Admitting: Urology

## 2021-09-11 ENCOUNTER — Other Ambulatory Visit: Payer: Self-pay | Admitting: *Deleted

## 2021-09-11 ENCOUNTER — Ambulatory Visit: Payer: Self-pay | Admitting: Urology

## 2021-09-11 DIAGNOSIS — N475 Adhesions of prepuce and glans penis: Secondary | ICD-10-CM

## 2021-09-25 ENCOUNTER — Ambulatory Visit: Payer: Self-pay | Admitting: Urology

## 2021-10-25 ENCOUNTER — Emergency Department (HOSPITAL_COMMUNITY)
Admission: EM | Admit: 2021-10-25 | Discharge: 2021-10-25 | Disposition: A | Discharge status: Home / Self Care | Payer: Self-pay | Attending: Emergency Medicine | Admitting: Emergency Medicine

## 2021-10-25 ENCOUNTER — Emergency Department (HOSPITAL_COMMUNITY): Payer: Self-pay

## 2021-10-25 ENCOUNTER — Other Ambulatory Visit: Payer: Self-pay

## 2021-10-25 ENCOUNTER — Encounter (HOSPITAL_COMMUNITY): Payer: Self-pay

## 2021-10-25 DIAGNOSIS — J069 Acute upper respiratory infection, unspecified: Secondary | ICD-10-CM | POA: Insufficient documentation

## 2021-10-25 DIAGNOSIS — J4531 Mild persistent asthma with (acute) exacerbation: Secondary | ICD-10-CM | POA: Insufficient documentation

## 2021-10-25 DIAGNOSIS — Z79899 Other long term (current) drug therapy: Secondary | ICD-10-CM | POA: Insufficient documentation

## 2021-10-25 MED ORDER — ALBUTEROL SULFATE HFA 108 (90 BASE) MCG/ACT IN AERS
2.0000 | INHALATION_SPRAY | Freq: Once | RESPIRATORY_TRACT | Status: AC
  Administered 2021-10-25: 2 via RESPIRATORY_TRACT
  Filled 2021-10-25: qty 6.7

## 2021-10-25 MED ORDER — METHYLPREDNISOLONE 4 MG PO TBPK: ORAL_TABLET | ORAL | 0 refills | Status: DC

## 2021-10-25 NOTE — ED Notes (Signed)
Pt refused respiratory swab.

## 2021-10-25 NOTE — ED Provider Notes (Signed)
?[facility]-EMERGENCY DEPT ?Provider Note ? ? ?CSN: [id number] ?Arrival date & time: 10/25/21  1038 ? ?  ? ?History ? ?Chief Complaint  ?Patient presents with  ? Shortness of Breath  ? Cough  ? ? ?Jimmy Mcdaniel is a 31 y.o. male who presents emergency department complaining of productive cough with green sputum, nasal congestion, shortness of breath for 6 days.  Patient does have a history of asthma, takes montelukast daily.  He used a nebulizer breathing treatment last night with some relief.  Today he tried to use his albuterol inhaler, but it was not working.  He states that he started feeling very anxious about this, and is not sure if he might of had a panic attack when it was not working.  Also reports fever at home, did not check his temperature.  Has been taking some over-the-counter medications, including allergy medicine. ? ? ?Shortness of Breath ?Associated symptoms: cough   ?Associated symptoms: no abdominal pain, no chest pain, no sore throat and no vomiting   ?Cough ?Associated symptoms: chills, myalgias and shortness of breath   ?Associated symptoms: no chest pain and no sore throat   ? ?  ? ?Home Medications ?Prior to Admission medications   ?Medication Sig Start Date End Date Taking? Authorizing Provider  ?albuterol (VENTOLIN HFA) 108 (90 Base) MCG/ACT inhaler Inhale 1-2 puffs into the lungs every 6 (six) hours as needed for wheezing or shortness of breath. 08/20/19   Palumbo, April, MD  ?Azelastine-Fluticasone 137-50 MCG/ACT SUSP One spray each nostril twice a day as needed. ?Patient not taking: Reported on 05/25/2020 07/07/19   Bobbitt, Heywood Iles, MD  ?EPINEPHrine (AUVI-Q) 0.3 mg/0.3 mL IJ SOAJ injection Use as directed for severe allergic reaction. ?Patient not taking: Reported on 10/18/2019 07/07/19   Bobbitt, Heywood Iles, MD  ?fluticasone [facility]) 50 MCG/ACT nasal spray Place 1 spray into both nostrils daily for 5 days. 06/16/21 06/21/21  Gilles Chiquito, MD  ?ibuprofen  (ADVIL) 200 MG tablet Take 400-600 mg by mouth every 6 (six) hours as needed for headache or moderate pain. ?Patient not taking: Reported on 05/25/2020    [provider]  ?levocetirizine (XYZAL) 5 MG tablet Take 1 tablet (5 mg total) by mouth daily as needed for allergies. ?Patient not taking: Reported on 10/18/2019 07/07/19   Bobbitt, Heywood Iles, MD  ?methocarbamol (ROBAXIN) 500 MG tablet Take 1 tablet (500 mg total) by mouth 2 (two) times daily. ?Patient not taking: Reported on 05/25/2020 09/14/18   Graciella Freer A, PA-C  ?methylPREDNISolone (MEDROL DOSEPAK) 4 MG TBPK tablet Take per package instructions 10/25/21   Anvita Hirata T, PA-C  ?naproxen (NAPROSYN) 500 MG tablet Take 500 mg by mouth 2 (two) times daily as needed for mild pain. ?Patient not taking: Reported on 05/25/2020    [provider]  ?Olopatadine HCl (PATADAY) 0.2 % SOLN One drop each eye once a day as needed for itchy eyes. ?Patient not taking: Reported on 10/18/2019 07/07/19   Bobbitt, Heywood Iles, MD  ?ondansetron (ZOFRAN) 4 MG tablet Take 1 tablet (4 mg total) by mouth every 8 (eight) hours as needed for up to 10 doses for nausea or vomiting. 06/16/21   Gilles Chiquito, MD  ?   ? ?Allergies    ?Patient has no known allergies.   ? ?Review of Systems   ?Review of Systems  ?Constitutional:  Positive for chills.  ?HENT:  Positive for congestion. Negative for sore throat.   ?Respiratory:  Positive for cough  and shortness of breath.   ?Cardiovascular:  Negative for chest pain.  ?Gastrointestinal:  Negative for abdominal pain, diarrhea, nausea and vomiting.  ?Musculoskeletal:  Positive for myalgias.  ?All other systems reviewed and are negative. ? ?Physical Exam ?Updated Vital Signs ?BP (!) 142/81 (BP Location: Left Arm)   Pulse (!) 108   Temp 99.3 ?F (37.4 ?C) (Oral)   Resp 18   Ht 6' (1.829 m)   Wt 92.1 kg   SpO2 99%   BMI 27.53 kg/m?  ?Physical Exam ?Vitals and nursing note reviewed.  ?Constitutional:   ?   Appearance:  Normal appearance.  ?HENT:  ?   Head: Normocephalic and atraumatic.  ?   Nose: Congestion present.  ?Eyes:  ?   Conjunctiva/sclera: Conjunctivae normal.  ?Cardiovascular:  ?   Rate and Rhythm: Normal rate and regular rhythm.  ?Pulmonary:  ?   Effort: Pulmonary effort is normal. No respiratory distress.  ?   Breath sounds: Normal breath sounds.  ?Abdominal:  ?   General: There is no distension.  ?   Palpations: Abdomen is soft.  ?   Tenderness: There is no abdominal tenderness.  ?Skin: ?   General: Skin is warm and dry.  ?Neurological:  ?   General: No focal deficit present.  ?   Mental Status: He is alert.  ? ? ?ED Results / Procedures / Treatments   ?Labs ?(all labs ordered are listed, but only abnormal results are displayed) ?Labs Reviewed - No data to display ? ?EKG ?None ? ?Radiology ?DG Chest 2 View ? ?Result Date: 10/25/2021 ?CLINICAL DATA:  Shortness of breath. EXAM: CHEST - 2 VIEW COMPARISON:  June 16, 2021. FINDINGS: The heart size and mediastinal contours are within normal limits. Both lungs are clear. The visualized skeletal structures are unremarkable. IMPRESSION: No active cardiopulmonary disease. Electronically Signed   By: Lupita Raider M.D.   On: 10/25/2021 11:33   ? ?Procedures ?Procedures  ? ? ?Medications Ordered in ED ?Medications  ?albuterol (VENTOLIN HFA) 108 (90 Base) MCG/ACT inhaler 2 puff (2 puffs Inhalation Given 10/25/21 1230)  ? ? ?ED Course/ Medical Decision Making/ A&P ?  ?                        ?Medical Decision Making ?Amount and/or Complexity of Data Reviewed ?Radiology: ordered. ? ?Risk ?Prescription drug management. ? ? ?This patient is a 31 year old male who presents to the ED for concern of cough.  ? ?Differential diagnoses prior to evaluation: ?The emergent differential diagnosis includes, but is not limited to,  Upper respiratory infection, lower respiratory infection, allergies, asthma, irritants, foreign body, medications (ACE inhibitors), reflux, asthma, CHF, lung cancer,  interstitial lung disease, psychiatric causes, postnasal drip and postinfectious bronchospasm.  ? ?This is not an exhaustive differential.  ? ?Past Medical History / Co-morbidities: ?Asthma ? ?Physical Exam: ?Physical exam performed. The pertinent findings include: Patient is afebrile, and in no acute distress.  Good oxygen saturation on room air.  Slightly tachycardic to 108, think this is likely related to heavy coughing.  Lung sounds are clear to auscultation in all fields.  Patient states that he is feeling some chest tightness associated with his asthma. ? ?Lab Tests/Imaging studies: ?I Ordered, and personally interpreted labs/imaging including chest x-ray.  The pertinent results include: No acute cardiopulmonary abnormalities. I agree with the radiologist interpretation.  I recommended respiratory testing for COVID and flu, patient denied this stating that he has had previous test  in the past week. ?  ?Medications: ?I ordered medication including albuterol inhaler for chest tightness.  I have reviewed the patients home medicines and have made adjustments as needed. ?  ?Disposition: ?After consideration of the diagnostic results and the patients response to treatment, I feel that patient's not requiring admission or inpatient treatment for his symptoms.  I think his symptoms are like related to a mild asthma exacerbation worsened by a virus or seasonal allergies.  Patient is currently taking allergy medication, and daily prescribed asthma medication.  He states that his albuterol inhaler stopped working, so we have given him one to take home with him.  We will also start him on a course of steroids, and recommend he follow-up with his primary doctor.  Discussed reasons to return to the emergency department, and the patient is agreeable to the plan. ? ?Portions of this report may have been transcribed using voice recognition software. Every effort was made to ensure accuracy; however, inadvertent computerized  transcription errors may be present.  ?Final Clinical Impression(s) / ED Diagnoses ?Final diagnoses:  ?Viral URI with cough  ?Mild persistent asthma with exacerbation  ? ? ?Rx / DC Orders ?ED Discharge Orders   ?

## 2021-10-25 NOTE — ED Triage Notes (Signed)
Patient reports that he has been having a productive  cough with green sputum, nasal congestion, and SOB x 5-6 days. ?Patient refused an EKG for the SOB. ? ? ?

## 2021-10-25 NOTE — Discharge Instructions (Addendum)
You were seen in the emergency department for cough. ? ?I think your symptoms are likely related to a virus or allergies. We've given you an inhaler to take home with you. I'm also prescribing you a course of steroids. Follow the package instructions for how much to take. ? ?Continue to monitor how you're doing and return to the ER for new or worsening symptoms. ?

## 2021-10-25 NOTE — ED Notes (Signed)
Pt handed me a EKG printout from EMS, pt states it is a tightness from congestion and doesn't think he needs another EKG. Pt states EMS told him it was fine and he needs to be seen for the tightness/coughing. ?

## 2021-12-25 ENCOUNTER — Ambulatory Visit (INDEPENDENT_AMBULATORY_CARE_PROVIDER_SITE_OTHER): Payer: 59 | Admitting: Urology

## 2021-12-25 ENCOUNTER — Encounter: Payer: Self-pay | Admitting: Urology

## 2021-12-25 VITALS — BP 111/71 | HR 69 | Ht 72.0 in | Wt 208.0 lb

## 2021-12-25 DIAGNOSIS — N475 Adhesions of prepuce and glans penis: Secondary | ICD-10-CM

## 2021-12-25 MED ORDER — DIAZEPAM 5 MG PO TABS: 5.0000 mg | ORAL_TABLET | Freq: Once | ORAL | 0 refills | Status: DC | PRN
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
## 2021-12-25 NOTE — Patient Instructions (Signed)
WHAT EXACTLY IS A SKIN BRIDGE? In a circumcised male, penile skin bridge occurs when the circumcision incision heals improperly such that the shaft skin becomes permanently attached to the head of the penis called the glans. Although penile skin bridge can happen in infancy right after the newborn circumcision, a male may not notice it until he is older and feels a slight tugging when he has an erection.  HOW IS A SKIN BRIDGE EXCISED? To correct the skin bridge, it is cut away, or excised. This is a simple procedure that is done as an outpatient. If the procedure is done in the office, a numbing cream is applied and the skin bridge is gently cut away from the coronal margin. If the bridge is especially thick or has come farther up on the glans, surgery under general anesthesia may be required. A circumcision revision may be recommended if the patient has significant skin bridges.  RECOVERY FROM THE EXCISION A full recovery of a skin bridge excision may take only a few days. An over-the-counter (OTC) antibiotic cream or ointment may be recommended. The patient, or the patient's parents if he is very young, will be instructed on how to properly pull the penile skin to prevent the recurrence of the skin bridges. If you notice an adhesion forming, it is important to try to gently pull it apart immediately. Vaseline can often be used to soften adhesions and prevent skin bridges from forming. If you are not sure if an attachment represents simple adhesion or a skin bridge, please see a doctor for evaluation.

## 2022-01-01 ENCOUNTER — Ambulatory Visit (INDEPENDENT_AMBULATORY_CARE_PROVIDER_SITE_OTHER): Payer: 59 | Admitting: Urology

## 2022-01-01 ENCOUNTER — Encounter: Payer: Self-pay | Admitting: Urology

## 2022-01-01 VITALS — BP 110/69 | HR 78 | Ht 72.0 in | Wt 208.0 lb

## 2022-01-01 DIAGNOSIS — N475 Adhesions of prepuce and glans penis: Secondary | ICD-10-CM | POA: Diagnosis not present

## 2022-01-01 NOTE — Progress Notes (Signed)
   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  Informed consent was obtained.  He was prepped and draped in standard sterile fashion.  On exam there were 3 distinct skin bridges measuring 3 to 4 mm each at the right dorsal aspect of the penis.  I was easily able to slide a hemostat underneath the skin bridges.  Plain lidocaine was injected into the skin bridges.  A clamp was applied, and iris scissors were used to divide the 3 skin bridges.  There was minimal bleeding, and spot cautery was used for meticulous hemostasis.  No sutures were required.  Small amount of bacitracin applied.  Recommendations: Apply bacitracin twice daily, avoid any sexual activity for at least 2 weeks or until completely healed, can follow-up for urology for wound check if any concerns with healing   Sondra Come, MD  [facility] [address] [place], [place] [address] [phone]

## 2022-01-24 ENCOUNTER — Emergency Department (HOSPITAL_COMMUNITY)
Admission: EM | Admit: 2022-01-24 | Discharge: 2022-01-24 | Disposition: A | Discharge status: Home / Self Care | Payer: 59 | Attending: Emergency Medicine | Admitting: Emergency Medicine

## 2022-01-24 ENCOUNTER — Encounter (HOSPITAL_COMMUNITY): Payer: Self-pay

## 2022-01-24 ENCOUNTER — Emergency Department (HOSPITAL_COMMUNITY): Payer: 59

## 2022-01-24 DIAGNOSIS — R109 Unspecified abdominal pain: Secondary | ICD-10-CM | POA: Diagnosis not present

## 2022-01-24 DIAGNOSIS — R509 Fever, unspecified: Secondary | ICD-10-CM | POA: Diagnosis not present

## 2022-01-24 LAB — CBC WITH DIFFERENTIAL/PLATELET
Abs Immature Granulocytes: 0.02 10*3/uL (ref 0.00–0.07)
Basophils Absolute: 0 10*3/uL (ref 0.0–0.1)
Basophils Relative: 0 %
Eosinophils Absolute: 0.1 10*3/uL (ref 0.0–0.5)
Eosinophils Relative: 1 %
HCT: 49.3 % (ref 39.0–52.0)
Hemoglobin: 15.6 g/dL (ref 13.0–17.0)
Immature Granulocytes: 0 %
Lymphocytes Relative: 12 %
Lymphs Abs: 0.9 10*3/uL (ref 0.7–4.0)
MCH: 27.2 pg (ref 26.0–34.0)
MCHC: 31.6 g/dL (ref 30.0–36.0)
MCV: 85.9 fL (ref 80.0–100.0)
Monocytes Absolute: 1 10*3/uL (ref 0.1–1.0)
Monocytes Relative: 13 %
Neutro Abs: 5.8 10*3/uL (ref 1.7–7.7)
Neutrophils Relative %: 74 %
Platelets: 172 10*3/uL (ref 150–400)
RBC: 5.74 MIL/uL (ref 4.22–5.81)
RDW: 11.8 % (ref 11.5–15.5)
WBC: 7.8 10*3/uL (ref 4.0–10.5)
nRBC: 0 % (ref 0.0–0.2)

## 2022-01-24 LAB — LIPASE, BLOOD: Lipase: 24 U/L (ref 11–51)

## 2022-01-24 LAB — COMPREHENSIVE METABOLIC PANEL
ALT: 40 U/L (ref 0–44)
AST: 24 U/L (ref 15–41)
Albumin: 4.7 g/dL (ref 3.5–5.0)
Alkaline Phosphatase: 63 U/L (ref 38–126)
Anion gap: 7 (ref 5–15)
BUN: 13 mg/dL (ref 6–20)
CO2: 29 mmol/L (ref 22–32)
Calcium: 9.6 mg/dL (ref 8.9–10.3)
Chloride: 104 mmol/L (ref 98–111)
Creatinine, Ser: 1.05 mg/dL (ref 0.61–1.24)
GFR, Estimated: >60 mL/min (ref >60)
Glucose, Bld: 111 mg/dL — ABNORMAL HIGH (ref 70–99)
Potassium: 4.2 mmol/L (ref 3.5–5.1)
Sodium: 140 mmol/L (ref 135–145)
Total Bilirubin: 0.6 mg/dL (ref 0.3–1.2)
Total Protein: 8.1 g/dL (ref 6.5–8.1)

## 2022-01-24 LAB — URINALYSIS, ROUTINE W REFLEX MICROSCOPIC
Bilirubin Urine: NEGATIVE
Glucose, UA: NEGATIVE mg/dL
Hgb urine dipstick: NEGATIVE
Ketones, ur: NEGATIVE mg/dL
Leukocytes,Ua: NEGATIVE
Nitrite: NEGATIVE
Protein, ur: NEGATIVE mg/dL
Specific Gravity, Urine: 1.008 (ref 1.005–1.030)
pH: 6 (ref 5.0–8.0)

## 2022-01-24 MED ORDER — ACETAMINOPHEN 325 MG PO TABS
650.0000 mg | ORAL_TABLET | Freq: Once | ORAL | Status: AC
  Administered 2022-01-24: 650 mg via ORAL
  Filled 2022-01-24: qty 2

## 2022-01-24 NOTE — ED Triage Notes (Signed)
Pt c/o one day of R sided flank pain and new onset of fever today. Pt states he also began having a cough yesterday. Prior to arrival pt states that his fever was 102.3. Denies taking any antipyretics PTA.

## 2022-01-24 NOTE — ED Provider Notes (Signed)
[facility]-EMERGENCY DEPT Provider Note   CSN: [id number] Arrival date & time: 01/24/22  1714     History  Chief Complaint  Patient presents with   Flank Pain   Fever    Jimmy Mcdaniel is a 31 y.o. male.   Flank Pain  Fever   Patient presents due to right flank pain.  It started today worsening, he has had intermittently over the last week.  Sometimes is worse with movement, sometimes he has it at rest.  Not worse with any breathing or coughing.  States he has been coughing as of yesterday and is nonproductive, states she has a new onset of fever today which was 102 when he took it at home.  He did not have any antipyretics prior to arrival and was not found to be febrile here but had an elevated temperature of 100.1.  He is not having any hematuria or dysuria.  Denies any injuries to the back, IV drug use, immunocompromise status.  No saddle anesthesia, lower extremity weakness.  Home Medications Prior to Admission medications   Medication Sig Start Date End Date Taking? Authorizing Provider  albuterol (VENTOLIN HFA) 108 (90 Base) MCG/ACT inhaler Inhale 1-2 puffs into the lungs every 6 (six) hours as needed for wheezing or shortness of breath. 08/20/19   Palumbo, April, MD  Azelastine-Fluticasone 137-50 MCG/ACT SUSP One spray each nostril twice a day as needed. 07/07/19   Bobbitt, Heywood Iles, MD  diazepam (VALIUM) 5 MG tablet Take 1 tablet (5 mg total) by mouth once as needed for up to 1 dose (take 45 minutes prior to urology procedure). 12/25/21   Sondra Come, MD  EPINEPHrine (AUVI-Q) 0.3 mg/0.3 mL IJ SOAJ injection Use as directed for severe allergic reaction. Patient not taking: Reported on 12/25/2021 07/07/19   Bobbitt, Heywood Iles, MD  ibuprofen (ADVIL) 200 MG tablet Take 400-600 mg by mouth every 6 (six) hours as needed for headache or moderate pain.    [provider]  levocetirizine (XYZAL) 5 MG tablet Take 1 tablet (5 mg total) by mouth  daily as needed for allergies. 07/07/19   Bobbitt, Heywood Iles, MD  methocarbamol (ROBAXIN) 500 MG tablet Take 1 tablet (500 mg total) by mouth 2 (two) times daily. 09/14/18   Graciella Freer A, PA-C  montelukast (SINGULAIR) 10 MG tablet Take by mouth. 01/04/20   [provider]  naproxen (NAPROSYN) 500 MG tablet Take by mouth. 09/11/21 09/11/22  [provider]  valACYclovir (VALTREX) 1000 MG tablet Take by mouth. 08/08/21 08/08/22  [provider]      Allergies    Patient has no known allergies.    Review of Systems   Review of Systems  Constitutional:  Positive for fever.  Genitourinary:  Positive for flank pain.    Physical Exam Updated Vital Signs BP (!) 132/91 (BP Location: Right Arm)   Pulse 76   Temp 100.1 F (37.8 C) (Oral)   Resp 16   SpO2 100%  Physical Exam Vitals and nursing note reviewed. Exam conducted with a chaperone present.  Constitutional:      Appearance: Normal appearance.  HENT:     Head: Normocephalic and atraumatic.  Eyes:     General: No scleral icterus.       Right eye: No discharge.        Left eye: No discharge.     Extraocular Movements: Extraocular movements intact.     Pupils: Pupils are equal, round, and reactive to light.  Cardiovascular:     Rate and Rhythm: Normal rate and regular rhythm.     Pulses: Normal pulses.     Heart sounds: Normal heart sounds. No murmur heard.    No friction rub. No gallop.  Pulmonary:     Effort: Pulmonary effort is normal. No respiratory distress.     Breath sounds: Normal breath sounds.  Abdominal:     General: Abdomen is flat. Bowel sounds are normal. There is no distension.     Palpations: Abdomen is soft.     Tenderness: There is no abdominal tenderness. There is right CVA tenderness.  Musculoskeletal:        General: No tenderness. Normal range of motion.     Comments: No midline tenderness  Skin:    General: Skin is warm and dry.     Coloration: Skin is not jaundiced.   Neurological:     Mental Status: He is alert. Mental status is at baseline.     Coordination: Coordination normal.     Comments: Cranial nerves II through XII are grossly intact.     ED Results / Procedures / Treatments   Labs (all labs ordered are listed, but only abnormal results are displayed) Labs Reviewed  COMPREHENSIVE METABOLIC PANEL - Abnormal; Notable for the following components:      Result Value   Glucose, Bld 111 (*)    All other components within normal limits  URINALYSIS, ROUTINE W REFLEX MICROSCOPIC - Abnormal; Notable for the following components:   Color, Urine STRAW (*)    All other components within normal limits  CBC WITH DIFFERENTIAL/PLATELET  LIPASE, BLOOD    EKG None  Radiology CT Renal Stone Study  Result Date: 01/24/2022 CLINICAL DATA:  Right flank pain. EXAM: CT ABDOMEN AND PELVIS WITHOUT CONTRAST TECHNIQUE: Multidetector CT imaging of the abdomen and pelvis was performed following the standard protocol without IV contrast. RADIATION DOSE REDUCTION: This exam was performed according to the departmental dose-optimization program which includes automated exposure control, adjustment of the mA and/or kV according to patient size and/or use of iterative reconstruction technique. COMPARISON:  CT February 03, 2018. FINDINGS: Lower chest: No acute abnormality. Hepatobiliary: Unremarkable noncontrast enhanced appearance of the liver and gallbladder. No biliary ductal dilation. Pancreas: No pancreatic ductal dilation or evidence of acute inflammation. Spleen: No splenomegaly or focal splenic lesion. Adrenals/Urinary Tract: Bilateral adrenal glands appear normal. No hydronephrosis. No renal, ureteral or bladder calculi identified. Stomach/Bowel: No radiopaque enteric contrast material was administered. Stomach is minimally distended limiting evaluation. No pathologic dilation of small or large bowel. The appendix and terminal ileum appear normal. No evidence of acute bowel  inflammation. Vascular/Lymphatic: Normal caliber abdominal aorta. No pathologically enlarged abdominal or pelvic lymph nodes. Misty appearance of the small bowel mesentery with prominent mesenteric lymph nodes measuring up to 5 mm on image 42/2, similar prior. Reproductive: Prostate is unremarkable. Other: No significant abdominopelvic free fluid. Musculoskeletal: No acute or significant osseous findings. IMPRESSION: No acute abnormality in the abdomen or pelvis. Specifically no evidence of obstructive uropathy. Electronically Signed   By: Maudry Mayhew M.D.   On: 01/24/2022 18:13    Procedures Procedures    Medications Ordered in ED Medications  acetaminophen (TYLENOL) tablet 650 mg (650 mg Oral Given 01/24/22 1801)    ED Course/ Medical Decision Making/ A&P                           Medical Decision Making Amount and/or  Complexity of Data Reviewed Labs: ordered. Radiology: ordered.  Risk OTC drugs.   Patient presents due to right flank pain and fever.  Differential includes but not limited to nephrolithiasis, UTI, pyelonephritis, abscess, cauda equina although unlikely, pneumonia, referred MSK pain.  On exam patient has an elevated temperature of 100.1 but is not febrile.  Not tachycardic, no hypoxia.  His lungs are clear to auscultation he does have right CVA tenderness without any midline tenderness.  No focal deficits on neuro exam.  I ordered and reviewed laboratory work-up.  No leukocytosis or anemia, no gross electrolyte derangement or AKI.  Lipase is unremarkable.  UA negative.  I ordered and reviewed CT renal study.  There is no acute process noted, no nephrolithiasis specifically.  On exam patient resting comfortably, no new symptoms.  I ordered x-ray due to cough.  Patient is not in room, he apparently left prior to x-ray and reevaluation.  Technically patient is leaving AMA.        Final Clinical Impression(s) / ED Diagnoses Final diagnoses:  None    Rx / DC  Orders ED Discharge Orders     None         Theron Arista, PA-C 01/24/22 2131    Rolan Bucco, MD 02/09/22 1458

## 2022-01-24 NOTE — ED Provider Triage Note (Cosign Needed)
Emergency Medicine Provider Triage Evaluation Note  Jimmy Mcdaniel , a 31 y.o. male  was evaluated in triage.  Pt complains of right flank pain.  It started today, he has had it intermittently over the last week.  Denies any dysuria or hematuria, no previous surgeries to the back or abdomen.  Denies any abdominal pain, nausea or vomiting.  He has not had intermittent fevers..  Review of Systems  Per HPI  Physical Exam  BP (!) 132/91 (BP Location: Right Arm)   Pulse 76   Temp 100.1 F (37.8 C) (Oral)   Resp 16   SpO2 100%  Gen:   Awake, no distress   Resp:  Normal effort  MSK:   Moves extremities without difficulty  Other:  Right CVA tenderness  Medical Decision Making  Medically screening exam initiated at 5:45 PM.  Appropriate orders placed.  Jimmy Mcdaniel was informed that the remainder of the evaluation will be completed by another provider, this initial triage assessment does not replace that evaluation, and the importance of remaining in the ED until their evaluation is complete.     Theron Arista, PA-C 01/24/22 1746

## 2022-07-06 ENCOUNTER — Emergency Department (HOSPITAL_COMMUNITY)
Admission: EM | Admit: 2022-07-06 | Discharge: 2022-07-07 | Discharge status: Against Medical Advice | Payer: 59 | Attending: Emergency Medicine | Admitting: Emergency Medicine

## 2022-07-06 ENCOUNTER — Other Ambulatory Visit: Payer: Self-pay

## 2022-07-06 ENCOUNTER — Encounter (HOSPITAL_COMMUNITY): Payer: Self-pay

## 2022-07-06 ENCOUNTER — Emergency Department (HOSPITAL_COMMUNITY): Payer: 59

## 2022-07-06 DIAGNOSIS — Z5321 Procedure and treatment not carried out due to patient leaving prior to being seen by health care provider: Secondary | ICD-10-CM | POA: Insufficient documentation

## 2022-07-06 DIAGNOSIS — M545 Low back pain, unspecified: Secondary | ICD-10-CM | POA: Insufficient documentation

## 2022-07-06 DIAGNOSIS — R519 Headache, unspecified: Secondary | ICD-10-CM | POA: Insufficient documentation

## 2022-07-06 LAB — CBC WITH DIFFERENTIAL/PLATELET
Abs Immature Granulocytes: 0.02 10*3/uL (ref 0.00–0.07)
Basophils Absolute: 0 10*3/uL (ref 0.0–0.1)
Basophils Relative: 0 %
Eosinophils Absolute: 0 10*3/uL (ref 0.0–0.5)
Eosinophils Relative: 0 %
HCT: 46.2 % (ref 39.0–52.0)
Hemoglobin: 14.8 g/dL (ref 13.0–17.0)
Immature Granulocytes: 0 %
Lymphocytes Relative: 8 %
Lymphs Abs: 0.5 10*3/uL — ABNORMAL LOW (ref 0.7–4.0)
MCH: 27.2 pg (ref 26.0–34.0)
MCHC: 32 g/dL (ref 30.0–36.0)
MCV: 84.8 fL (ref 80.0–100.0)
Monocytes Absolute: 0.7 10*3/uL (ref 0.1–1.0)
Monocytes Relative: 11 %
Neutro Abs: 5 10*3/uL (ref 1.7–7.7)
Neutrophils Relative %: 81 %
Platelets: 145 10*3/uL — ABNORMAL LOW (ref 150–400)
RBC: 5.45 MIL/uL (ref 4.22–5.81)
RDW: 11.9 % (ref 11.5–15.5)
WBC: 6.2 10*3/uL (ref 4.0–10.5)
nRBC: 0 % (ref 0.0–0.2)

## 2022-07-06 LAB — COMPREHENSIVE METABOLIC PANEL
ALT: 56 U/L — ABNORMAL HIGH (ref 0–44)
AST: 85 U/L — ABNORMAL HIGH (ref 15–41)
Albumin: 4.4 g/dL (ref 3.5–5.0)
Alkaline Phosphatase: 62 U/L (ref 38–126)
Anion gap: 8 (ref 5–15)
BUN: 12 mg/dL (ref 6–20)
CO2: 26 mmol/L (ref 22–32)
Calcium: 9.4 mg/dL (ref 8.9–10.3)
Chloride: 105 mmol/L (ref 98–111)
Creatinine, Ser: 1.09 mg/dL (ref 0.61–1.24)
GFR, Estimated: >60 mL/min (ref >60)
Glucose, Bld: 113 mg/dL — ABNORMAL HIGH (ref 70–99)
Potassium: 4.1 mmol/L (ref 3.5–5.1)
Sodium: 139 mmol/L (ref 135–145)
Total Bilirubin: 0.9 mg/dL (ref 0.3–1.2)
Total Protein: 7.4 g/dL (ref 6.5–8.1)

## 2022-07-06 MED ORDER — IBUPROFEN 400 MG PO TABS
600.0000 mg | ORAL_TABLET | Freq: Once | ORAL | Status: AC
  Administered 2022-07-06: 600 mg via ORAL
  Filled 2022-07-06: qty 1

## 2022-07-06 MED ORDER — ONDANSETRON 4 MG PO TBDP
8.0000 mg | ORAL_TABLET | Freq: Once | ORAL | Status: AC
  Administered 2022-07-06: 8 mg via ORAL
  Filled 2022-07-06: qty 2

## 2022-07-06 NOTE — ED Notes (Signed)
Pt did not respond.

## 2022-07-06 NOTE — ED Triage Notes (Signed)
Pt arrived POV from home c/o waking up to a nose bleed this morning then he started with a headache. Pt now feels nauseous and states he feels jittery. Pt also endorses lower back that started this morning.

## 2022-07-06 NOTE — ED Provider Triage Note (Signed)
Emergency Medicine Provider Triage Evaluation Note  Johnryan Sao , a 31 y.o. male  was evaluated in triage.  Pt complains of headache, low back pain.  Patient reports symptom onset this morning upon awakening.  He notes headache as frontal and posterior in nature.  He reported some blurry vision bilaterally with headache as well as feelings of nausea with 3 episodes of emesis.  Denies history of similar symptoms.  Took Tylenol this morning which helped minimally.  Also presents with low back pain.  Reports pain with any movement of his back and increased pain with walking.  Denies saddle anesthesia, bowel/bladder dysfunction, history of IV drug use, sensory deficits on lower extremities.   Review of Systems  Positive: See above Negative:   Physical Exam  BP 127/71   Pulse 91   Temp 98.5 F (36.9 C) (Oral)   Resp 18   Ht 6' (1.829 m)   Wt 96.2 kg   SpO2 100%   BMI 28.75 kg/m  Gen:   Awake, no distress   Resp:  Normal effort  MSK:   Moves extremities without difficulty  Other:  Cranial nerves III through XII grossly intact.  Patient able to move all 4 extremities but with back pain exacerbated moving lower extremities.  Medical Decision Making  Medically screening exam initiated at 1:18 PM.  Appropriate orders placed.  Almin Tellefsen was informed that the remainder of the evaluation will be completed by another provider, this initial triage assessment does not replace that evaluation, and the importance of remaining in the ED until their evaluation is complete.     Peter Garter, [place] 07/06/22 1320

## 2022-09-11 ENCOUNTER — Encounter: Payer: Self-pay | Admitting: Emergency Medicine

## 2022-09-11 ENCOUNTER — Ambulatory Visit
Admission: EM | Admit: 2022-09-11 | Discharge: 2022-09-11 | Disposition: A | Discharge status: Home / Self Care | Payer: 59 | Attending: Emergency Medicine | Admitting: Emergency Medicine

## 2022-09-11 DIAGNOSIS — J101 Influenza due to other identified influenza virus with other respiratory manifestations: Secondary | ICD-10-CM | POA: Diagnosis not present

## 2022-09-11 MED ORDER — BENZONATATE 100 MG PO CAPS: 200.0000 mg | ORAL_CAPSULE | Freq: Three times a day (TID) | ORAL | 0 refills | Status: DC

## 2022-09-11 MED ORDER — PROMETHAZINE-DM 6.25-15 MG/5ML PO SYRP: 5.0000 mL | ORAL_SOLUTION | Freq: Four times a day (QID) | ORAL | 0 refills | Status: DC | PRN

## 2022-09-11 MED ORDER — IPRATROPIUM BROMIDE 0.06 % NA SOLN: 2.0000 | Freq: Four times a day (QID) | NASAL | 12 refills | Status: DC

## 2022-09-11 NOTE — Discharge Instructions (Addendum)
You are outside the window for Tamiflu.  Take OTC Tylenol 1000 mg with Ibuprofen 600 mg every 6 hours for fever and body aches.  Use the Atrovent nasal spray, 2 squirts up each nostril every 6 hours, as needed for nasal congestion and runny nose.  Use over-the-counter Delsym, Zarbee's, or Robitussin during the day as needed for cough.  Use the Tessalon Perles every 8 hours as needed for cough.  Taken with a small sip of water.  You may experience some numbness to your tongue or metallic taste in her mouth, this is normal.  Use the Promethazine DM cough syrup at bedtime as will make you drowsy but it should help dry up your postnasal drip and aid you in sleep and cough relief.  Return for reevaluation, or see your primary care provider, for new or worsening symptoms.

## 2022-09-11 NOTE — ED Triage Notes (Signed)
Pt presents to [facility] for cough, body aches, sore throat. He was seen yesterday and tested for flu and was positive for flu B, strep negative and culture was sent out.
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
## 2022-09-13 ENCOUNTER — Emergency Department (HOSPITAL_COMMUNITY)
Admission: EM | Admit: 2022-09-13 | Discharge: 2022-09-13 | Disposition: A | Discharge status: Home / Self Care | Payer: 59 | Attending: Emergency Medicine | Admitting: Emergency Medicine

## 2022-09-13 ENCOUNTER — Emergency Department (HOSPITAL_COMMUNITY): Payer: 59

## 2022-09-13 ENCOUNTER — Encounter (HOSPITAL_COMMUNITY): Payer: Self-pay

## 2022-09-13 DIAGNOSIS — J101 Influenza due to other identified influenza virus with other respiratory manifestations: Secondary | ICD-10-CM | POA: Diagnosis not present

## 2022-09-13 DIAGNOSIS — R1013 Epigastric pain: Secondary | ICD-10-CM

## 2022-09-13 DIAGNOSIS — K2900 Acute gastritis without bleeding: Secondary | ICD-10-CM | POA: Insufficient documentation

## 2022-09-13 DIAGNOSIS — J45909 Unspecified asthma, uncomplicated: Secondary | ICD-10-CM | POA: Diagnosis not present

## 2022-09-13 DIAGNOSIS — K5901 Slow transit constipation: Secondary | ICD-10-CM | POA: Insufficient documentation

## 2022-09-13 HISTORY — DX: Unspecified asthma, uncomplicated: J45.909

## 2022-09-13 LAB — CBC WITH DIFFERENTIAL/PLATELET
Abs Immature Granulocytes: 0.01 10*3/uL (ref 0.00–0.07)
Basophils Absolute: 0 10*3/uL (ref 0.0–0.1)
Basophils Relative: 0 %
Eosinophils Absolute: 0 10*3/uL (ref 0.0–0.5)
Eosinophils Relative: 0 %
HCT: 45.2 % (ref 39.0–52.0)
Hemoglobin: 14.5 g/dL (ref 13.0–17.0)
Immature Granulocytes: 0 %
Lymphocytes Relative: 27 %
Lymphs Abs: 1.6 10*3/uL (ref 0.7–4.0)
MCH: 26.9 pg (ref 26.0–34.0)
MCHC: 32.1 g/dL (ref 30.0–36.0)
MCV: 83.9 fL (ref 80.0–100.0)
Monocytes Absolute: 0.5 10*3/uL (ref 0.1–1.0)
Monocytes Relative: 8 %
Neutro Abs: 3.6 10*3/uL (ref 1.7–7.7)
Neutrophils Relative %: 65 %
Platelets: 111 10*3/uL — ABNORMAL LOW (ref 150–400)
RBC: 5.39 MIL/uL (ref 4.22–5.81)
RDW: 11.6 % (ref 11.5–15.5)
WBC: 5.7 10*3/uL (ref 4.0–10.5)
nRBC: 0 % (ref 0.0–0.2)

## 2022-09-13 LAB — COMPREHENSIVE METABOLIC PANEL
ALT: 27 U/L (ref 0–44)
AST: 46 U/L — ABNORMAL HIGH (ref 15–41)
Albumin: 3.3 g/dL — ABNORMAL LOW (ref 3.5–5.0)
Alkaline Phosphatase: 37 U/L — ABNORMAL LOW (ref 38–126)
Anion gap: 11 (ref 5–15)
BUN: 12 mg/dL (ref 6–20)
CO2: 23 mmol/L (ref 22–32)
Calcium: 7.8 mg/dL — ABNORMAL LOW (ref 8.9–10.3)
Chloride: 106 mmol/L (ref 98–111)
Creatinine, Ser: 1.29 mg/dL — ABNORMAL HIGH (ref 0.61–1.24)
GFR, Estimated: >60 mL/min (ref >60)
Glucose, Bld: 97 mg/dL (ref 70–99)
Potassium: 3.9 mmol/L (ref 3.5–5.1)
Sodium: 140 mmol/L (ref 135–145)
Total Bilirubin: 0.9 mg/dL (ref 0.3–1.2)
Total Protein: 6.2 g/dL — ABNORMAL LOW (ref 6.5–8.1)

## 2022-09-13 LAB — GROUP A STREP BY PCR: Group A Strep by PCR: NOT DETECTED

## 2022-09-13 LAB — LIPASE, BLOOD: Lipase: 44 U/L (ref 11–51)

## 2022-09-13 MED ORDER — IOHEXOL 300 MG/ML  SOLN: 100.0000 mL | Freq: Once | INTRAMUSCULAR | Status: DC | PRN

## 2022-09-13 MED ORDER — DICYCLOMINE HCL 10 MG/ML IM SOLN
10.0000 mg | Freq: Once | INTRAMUSCULAR | Status: AC
  Administered 2022-09-13: 10 mg via INTRAMUSCULAR
  Filled 2022-09-13: qty 2

## 2022-09-13 MED ORDER — ALUM & MAG HYDROXIDE-SIMETH 200-200-20 MG/5ML PO SUSP
15.0000 mL | Freq: Once | ORAL | Status: AC
  Administered 2022-09-13: 15 mL via ORAL
  Filled 2022-09-13: qty 30

## 2022-09-13 MED ORDER — LIDOCAINE VISCOUS HCL 2 % MT SOLN
15.0000 mL | Freq: Once | OROMUCOSAL | Status: AC
  Administered 2022-09-13: 15 mL via ORAL
  Filled 2022-09-13: qty 15

## 2022-09-13 NOTE — ED Triage Notes (Signed)
Pt presents to ED from home C/O fever, cough, congestion X 9 days. Tested positive for flu B 4 days ago. Reports abdominal pain and constipation X 4 days.
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
## 2022-09-13 NOTE — Discharge Instructions (Addendum)
Thank you for letting us take care of you today.  I suspect that your abdominal pain is related to gastritis due to recent use of ibuprofen while dealing with the flu.  Your lab work today is reassuring.  Your kidney function has slightly increased from your previous lab work so I do recommend that you follow-up with your PCP next week to have this reevaluated.  This is likely due to dehydration so that is important to have adequate fluid intake while dealing with her current illness.  I also recommend you maintain a bland diet at home until you are feeling better.  Please see following instructions for managing constipation as well. Mix 4 doses of miralax into 32 oz of a sports drink like Gatorade. Drink over 2 hours or until you have a good bowel movement. You may repeat once more if needed.    If you continue to have abdominal pain, I recommend that you follow-up with a gastroenterologist or a stomach doctor.  You may discuss this at your next visit with your PCP to see if this is indicated.  For any new or worsening emergent symptoms, please return to the nearest emergency department for reevaluation.

## 2022-09-13 NOTE — ED Notes (Signed)
Pt went to ct and pt refused ct scan. Pt states he is feeling better. Alver Fisher, [place] notified

## 2022-09-13 NOTE — ED Notes (Signed)
Jimmy Mcdaniel, [place] at bedside

## 2022-09-13 NOTE — ED Provider Notes (Signed)
[facility] Provider Note   CSN: [id number] Arrival date & time: 09/13/22  [id number]     History {Add pertinent medical, surgical, social history, OB history to HPI:1} Chief Complaint  Patient presents with   Abdominal Pain    Jimmy Mcdaniel is a 32 y.o. male with past medical history of asthma who presents to the ED complaining of burning epigastric pain that started this morning.  He states that he is on day 9 of upper respiratory symptoms and was diagnosed with influenza at previous visit.  He states most of his symptoms related to influenza have resolved except for some lingering congestion and now he has a sore throat and is worried he may have strep throat. No difficulty swallowing.  He states he has never had abdominal pain like this before.  He states his last bowel movement was 4 days ago and normally he has regular bowel movements 2-3 times daily. He denies nausea, vomiting, chest pain, shortness of breath, change in appetite, rectal bleeding or pain. He has not taken any medications to help with his abdominal pain or constipation at home. No previous abdominal surgeries.  He has been taking Tylenol and ibuprofen around-the-clock to deal with his flulike symptoms since this started 9 days ago.      Home Medications Prior to Admission medications   Medication Sig Start Date End Date Taking? Authorizing Provider  albuterol (VENTOLIN HFA) 108 (90 Base) MCG/ACT inhaler Inhale 1-2 puffs into the lungs every 6 (six) hours as needed for wheezing or shortness of breath. 08/20/19   Palumbo, April, MD  Azelastine-Fluticasone 137-50 MCG/ACT SUSP One spray each nostril twice a day as needed. 07/07/19   Bobbitt, Sedalia Muta, MD  benzonatate (TESSALON) 100 MG capsule Take 2 capsules (200 mg total) by mouth every 8 (eight) hours. 09/11/22   Margarette Canada, NP  diazepam (VALIUM) 5 MG tablet Take 1 tablet (5 mg total) by mouth once as needed for up to 1 dose  (take 45 minutes prior to urology procedure). 12/25/21   Billey Co, MD  EPINEPHrine (AUVI-Q) 0.3 mg/0.3 mL IJ SOAJ injection Use as directed for severe allergic reaction. Patient not taking: Reported on 12/25/2021 07/07/19   Bobbitt, Sedalia Muta, MD  ibuprofen (ADVIL) 200 MG tablet Take 400-600 mg by mouth every 6 (six) hours as needed for headache or moderate pain.    [provider]  ipratropium (ATROVENT) 0.06 % nasal spray Place 2 sprays into both nostrils 4 (four) times daily. 09/11/22   Margarette Canada, NP  levocetirizine (XYZAL) 5 MG tablet Take 1 tablet (5 mg total) by mouth daily as needed for allergies. 07/07/19   Bobbitt, Sedalia Muta, MD  methocarbamol (ROBAXIN) 500 MG tablet Take 1 tablet (500 mg total) by mouth 2 (two) times daily. 09/14/18   Providence Lanius A, PA-C  montelukast (SINGULAIR) 10 MG tablet Take by mouth. 01/04/20   [provider]  promethazine-dextromethorphan (PROMETHAZINE-DM) 6.25-15 MG/5ML syrup Take 5 mLs by mouth 4 (four) times daily as needed. 09/11/22   Margarette Canada, NP      Allergies    Patient has no known allergies.    Review of Systems   Review of Systems  All other systems reviewed and are negative.   Physical Exam Updated Vital Signs BP (!) 141/71 (BP Location: Right Arm)   Pulse 68   Temp 97.8 F (36.6 C) (Oral)   Resp 18   Ht 6' (1.829 m)   [facility]  95.3 kg   SpO2 98%   BMI 28.48 kg/m  Physical Exam Vitals and nursing note reviewed.  Constitutional:      General: He is not in acute distress.    Appearance: Normal appearance. He is not toxic-appearing.  HENT:     Head: Normocephalic and atraumatic.     Mouth/Throat:     Mouth: Mucous membranes are moist.     Tongue: Tongue does not deviate from midline.     Pharynx: Uvula midline. Oropharyngeal exudate (minimal on right tonsil) and posterior oropharyngeal erythema (mild posterior pharynx) present. No pharyngeal swelling or uvula swelling.     Tonsils: No tonsillar  abscesses. 1+ on the right. 1+ on the left.  Eyes:     Extraocular Movements: Extraocular movements intact.     Conjunctiva/sclera: Conjunctivae normal.  Cardiovascular:     Rate and Rhythm: Normal rate and regular rhythm.     Heart sounds: No murmur heard. Pulmonary:     Effort: Pulmonary effort is normal.     Breath sounds: Normal breath sounds.  Abdominal:     General: Abdomen is flat.     Palpations: Abdomen is soft. There is no shifting dullness, fluid wave, hepatomegaly, mass or pulsatile mass.     Tenderness: There is abdominal tenderness (mild) in the epigastric area. There is no guarding or rebound. Negative signs include Murphy's sign, Rovsing's sign, McBurney's sign, psoas sign and obturator sign.     Hernia: No hernia is present.  Musculoskeletal:        General: Normal range of motion.     Cervical back: Neck supple.     Right lower leg: No edema.     Left lower leg: No edema.  Skin:    General: Skin is warm and dry.     Capillary Refill: Capillary refill takes less than 2 seconds.  Neurological:     Mental Status: He is alert. Mental status is at baseline.  Psychiatric:        Behavior: Behavior normal.     ED Results / Procedures / Treatments   Labs (all labs ordered are listed, but only abnormal results are displayed) Labs Reviewed  GROUP A STREP BY PCR  CBC WITH DIFFERENTIAL/PLATELET  COMPREHENSIVE METABOLIC PANEL  LIPASE, BLOOD    EKG None  Radiology No results found.  Procedures Procedures  {Document cardiac monitor, telemetry assessment procedure when appropriate:1}  Medications Ordered in ED Medications  dicyclomine (BENTYL) injection 10 mg (has no administration in time range)    ED Course/ Medical Decision Making/ A&P   {   Click here for ABCD2, HEART and other calculatorsREFRESH Note before signing :1}                          Medical Decision Making Amount and/or Complexity of Data Reviewed Labs: ordered. Decision-making details  documented in ED Course. Radiology: ordered. Decision-making details documented in ED Course.  Risk OTC drugs. Prescription drug management.   Declined CT abdomen  {Document critical care time when appropriate:1} {Document review of labs and clinical decision tools ie heart score, Chads2Vasc2 etc:1}  {Document your independent review of radiology images, and any outside records:1} {Document your discussion with family members, caretakers, and with consultants:1} {Document social determinants of health affecting pt's care:1} {Document your decision making why or why not admission, treatments were needed:1} Final Clinical Impression(s) / ED Diagnoses Final diagnoses:  None    Rx / DC Orders ED Discharge  Orders     None

## 2023-08-12 ENCOUNTER — Encounter: Payer: Self-pay | Admitting: Allergy

## 2023-08-12 ENCOUNTER — Ambulatory Visit (INDEPENDENT_AMBULATORY_CARE_PROVIDER_SITE_OTHER): Payer: Self-pay | Admitting: Allergy

## 2023-08-12 VITALS — BP 108/78 | HR 68 | Temp 97.5°F | Resp 18 | Ht 70.47 in | Wt 215.5 lb

## 2023-08-12 DIAGNOSIS — J3089 Other allergic rhinitis: Secondary | ICD-10-CM

## 2023-08-12 DIAGNOSIS — J4530 Mild persistent asthma, uncomplicated: Secondary | ICD-10-CM

## 2023-08-12 MED ORDER — BUDESONIDE 0.5 MG/2ML IN SUSP: RESPIRATORY_TRACT | 2 refills | Status: AC

## 2023-08-12 NOTE — Patient Instructions (Addendum)
Breathing May use albuterol rescue inhaler 2 puffs or nebulizer every 4 to 6 hours as needed for shortness of breath, chest tightness, coughing, and wheezing. May use albuterol rescue inhaler 2 puffs 5 to 15 minutes prior to strenuous physical activities. Monitor frequency of use - if you need to use it more than twice per week on a consistent basis let us know.  Breathing control goals:  Full participation in all desired activities (may need albuterol before activity) Albuterol use two times or less a week on average (not counting use with activity) Cough interfering with sleep two times or less a month Oral steroids no more than once a year No hospitalizations   Rhinitis  Return for allergy skin testing. Will make additional recommendations based on results. If significant positives will recommend allergy injections - handout given. Make sure you don't take any antihistamines for 3 days before the skin testing appointment. Don't put any lotion on the back and arms on the day of testing.  Plan on being here for 30-60 minutes.  Start budesonide nasal saline wash twice a day as below.   Budesonide (Pulmicort) + Saline Irrigation/Rinse   Budesonide (Pulmicort) is an anti-inflammatory steroid medication used to decrease nasal and sinus inflammation. It is dispensed in liquid form in a vial. Although it is manufactured for use with a nebulizer, we intend for you to use it with the NeilMed Sinus Rinse bottle (preferred) or a Neti pot.    Instructions:  1) Make 240cc of saline in the NeilMed bottle using the salt packets or your own saline recipe (see separate handout).  2) Add the entire 2cc vial of liquid Budesonide (Pulmicort) to the rinse bottle and mix together.  3) While in the shower or over the sink, tilt your head forward to a comfortable level. Put the tip of the sinus rinse bottle in your nostril and aim it towards the crown or top of your head. Gently squeeze the bottle to flush out  your nose. The fluid will circulate in and out of your sinus cavities, coming back out from either nostril or through your mouth. Try not to swallow large quantities and spit it out instead.  4) Perform Budesonide (Pulmicort) + Saline irrigations 2 times daily.
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
## 2023-08-19 NOTE — Progress Notes (Unsigned)
Skin testing note  RE: Jimmy Mcdaniel MRN: [id number] DOB: 23-Sep-1990 Date of Office Visit: 08/20/2023  Referring provider: Macy Mis, MD Primary care provider: Macy Mis, MD  Chief Complaint: allergy testing  History of Present Illness: I had the pleasure of seeing Jimmy Mcdaniel for a skin testing visit at the [facility] on 08/20/2023. He is a 33 y.o. male, who is being followed for allergic rhinitis and asthma. His previous allergy office visit was on 08/12/2023 with Dr. Selena Batten. Today is a skin testing visit.   Discussed the use of AI scribe software for clinical note transcription with the patient, who gave verbal consent to proceed.  He is not using any daily allergy medications due to irritability as a side effect. He has attempted a budesonide saline wash without significant relief. Interested in AIT.     Assessment and Plan: Jimmy Mcdaniel is a 33 y.o. male with: Other allergic rhinitis Seasonal allergic rhinitis due to pollen Allergic rhinitis due to animal dander Allergic rhinitis due to dust mite Past history - Daily nasal congestion and stuffiness. No relief with previous nasal sprays due to irritation and nosebleeds. No improvement with oral allergy medications. 2020 skin testing positive to grass, ragweed, weed, trees, mold, dust mites, cat and dog. No prior AIT. No recent ENT evaluation.  Today's skin testing positive to grass, ragweed, weed, trees, dust mites, cat, dog.  Start environmental control measures as below. Use over the counter antihistamines such as Zyrtec (cetirizine), Claritin (loratadine), Allegra (fexofenadine), or Xyzal (levocetirizine) daily as needed. May take twice a day during allergy flares. May switch antihistamines every few months. Try to take an antihistamine on the days of injections.  Continue budesonide nasal saline wash twice a day. Start allergy injections - 2 shots.  Had a detailed discussion with patient/family that  clinical history is suggestive of allergic rhinitis, and may benefit from allergy immunotherapy (AIT). Discussed in detail regarding the dosing, schedule, side effects (mild to moderate local allergic reaction and rarely systemic allergic reactions including anaphylaxis), and benefits (significant improvement in nasal symptoms, seasonal flares of asthma) of immunotherapy with the patient. There is significant time commitment involved with allergy shots, which includes weekly immunotherapy injections for first 9-12 months and then biweekly to monthly injections for 3-5 years. Consent was signed. I have prescribed epinephrine injectable and demonstrated proper use. For mild symptoms you can take over the counter antihistamines such as Benadryl and monitor symptoms closely. If symptoms worsen or if you have severe symptoms including breathing issues, throat closure, significant swelling, whole body hives, severe diarrhea and vomiting, lightheadedness then inject epinephrine and seek immediate medical care afterwards. Emergency action plan given.   Mild persistent asthma without complication Past history - daily symptoms of shortness of breath and chest tightness. No recent exacerbations requiring ER visit or hospitalization. Currently using albuterol as needed, but no daily controller medication. 2025 spirometry showed: No overt abnormalities noted given today's efforts with no improvement in FEV1 post bronchodilator treatment. Clinically feeling unchanged. Discussed a 2 month trial of using ICS/LABA inhaler given symptoms but patient declined at this time.  May use albuterol rescue inhaler 2 puffs or nebulizer every 4 to 6 hours as needed for shortness of breath, chest tightness, coughing, and wheezing. May use albuterol rescue inhaler 2 puffs 5 to 15 minutes prior to strenuous physical activities. Monitor frequency of use - if you need to use it more than twice per week on a consistent basis let  us know.    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 If you have a reaction let us know. For mild symptoms you can take over the counter antihistamines such as Benadryl 1-2 tablets = 25-50mg  and monitor symptoms closely. If symptoms worsen or if you have severe symptoms including breathing issues, throat closure, significant swelling, whole body hives, severe diarrhea and vomiting, lightheadedness then inject epinephrine and seek immediate medical care afterwards.  Return in about 4 months (around 12/18/2023).  Meds ordered this encounter  Medications   DISCONTD: EPINEPHrine 0.3 mg/0.3 mL IJ SOAJ injection    Sig: Inject 0.3 mg into the muscle as needed for anaphylaxis.    Dispense:  2 each    Refill:  1    May dispense generic/Mylan/Teva brand.   EPINEPHrine 0.3 mg/0.3 mL IJ SOAJ injection    Sig: Inject 0.3 mg into the muscle as needed for anaphylaxis.    Dispense:  2 each    Refill:  1    May dispense generic/Mylan/Teva brand.   Lab Orders  No laboratory test(s) ordered today    Diagnostics: Skin Testing: Environmental allergy panel. Today's skin testing: Positive to grass, ragweed, weed, trees, dust mites, cat, dog.  Results discussed with patient/family.  Airborne Adult Perc - 08/20/23 0903     Time Antigen Placed 1914    Allergen Manufacturer Waynette Buttery    Location Back    Number of Test 55    Panel 1 Select    1. Control-Buffer 50% Glycerol Negative    2. Control-Histamine 3+    3. Bahia Negative    4. [place] 2+    5. Johnson Negative    6. [place] Blue Negative    7. Meadow Fescue Negative    8. Perennial Rye 3+    9. Timothy Negative    10. Ragweed Mix Negative    11. Cocklebur Negative    12. Plantain,  English 2+    13. Baccharis Negative    14. Dog Fennel Negative    15. Russian Thistle Negative    16. Lamb's Quarters Negative    17. Sheep Sorrell Negative    18. Rough Pigweed Negative    19. Marsh Elder, Rough Negative    20. Mugwort, Common  Negative    21. Box, Elder Negative    22. Cedar, red Negative    23. Sweet Gum Negative    24. Pecan Pollen Negative    25. Pine Mix Negative    26. Walnut, Black Pollen Negative    27. Red Mulberry Negative    28. Ash Mix Negative    29. Birch Mix Negative    30. Beech American Negative    31. Cottonwood, Guinea-Bissau Negative    32. Hickory, White Negative    33. Maple Mix Negative    34. Oak, Guinea-[place] Mix Negative    35. Sycamore Eastern Negative    36. Alternaria Alternata Negative    37. Cladosporium Herbarum Negative    38. Aspergillus Mix Negative    39. Penicillium Mix Negative    40. Bipolaris Sorokiniana (Helminthosporium) Negative    41. Drechslera Spicifera (Curvularia) Negative    42. Mucor Plumbeus Negative    43. Fusarium Moniliforme Negative    44. Aureobasidium Pullulans (pullulara) Negative    45. Rhizopus Oryzae Negative    46. Botrytis Cinera Negative    47. Epicoccum Nigrum Negative    48. Phoma Betae Negative    49. Dust Mite Mix 3+  50. Cat Hair 10,000 BAU/ml Negative    51.  Dog Epithelia Negative    52. Mixed Feathers Negative    53. Horse Epithelia Negative    54. Cockroach, German Negative    55. Tobacco Leaf Negative             Intradermal - 08/20/23 0942     Time Antigen Placed 1610    Allergen Manufacturer Waynette Buttery    Location Arm    Number of Test 12    Intradermal Select    Control Negative    Bahia 3+    Johnson 3+    Ragweed Mix 3+    Tree Mix 3+    Mold 1 Negative    Mold 2 Negative    Mold 3 Negative    Mold 4 Negative    Cat 2+    Dog 3+    Cockroach Negative             Previous notes and tests were reviewed. The plan was reviewed with the patient/family, and all questions/concerned were addressed.  It was my pleasure to see Jimmy Mcdaniel today and participate in his care. Please feel free to contact me with any questions or concerns.  Sincerely,  Wyline Mood, DO Allergy & Immunology  [facility] office: [phone] [facility] office: [phone]

## 2023-08-20 ENCOUNTER — Encounter: Payer: Self-pay | Admitting: Allergy

## 2023-08-20 ENCOUNTER — Ambulatory Visit (INDEPENDENT_AMBULATORY_CARE_PROVIDER_SITE_OTHER): Payer: Managed Care, Other (non HMO) | Admitting: Allergy

## 2023-08-20 DIAGNOSIS — J301 Allergic rhinitis due to pollen: Secondary | ICD-10-CM

## 2023-08-20 DIAGNOSIS — J4530 Mild persistent asthma, uncomplicated: Secondary | ICD-10-CM

## 2023-08-20 DIAGNOSIS — T63481A Toxic effect of venom of other arthropod, accidental (unintentional), initial encounter: Secondary | ICD-10-CM

## 2023-08-20 DIAGNOSIS — J3089 Other allergic rhinitis: Secondary | ICD-10-CM | POA: Diagnosis not present

## 2023-08-20 DIAGNOSIS — T63481D Toxic effect of venom of other arthropod, accidental (unintentional), subsequent encounter: Secondary | ICD-10-CM

## 2023-08-20 DIAGNOSIS — J3081 Allergic rhinitis due to animal (cat) (dog) hair and dander: Secondary | ICD-10-CM

## 2023-08-20 MED ORDER — EPINEPHRINE 0.3 MG/0.3ML IJ SOAJ: 0.3000 mg | INTRAMUSCULAR | 1 refills | Status: DC | PRN

## 2023-08-20 NOTE — Patient Instructions (Addendum)
Today's skin testing: Positive to grass, ragweed, weed, trees, dust mites, cat, dog.   Results given.  Environmental allergies Start environmental control measures as below. Use over the counter antihistamines such as Zyrtec (cetirizine), Claritin (loratadine), Allegra (fexofenadine), or Xyzal (levocetirizine) daily as needed. May take twice a day during allergy flares. May switch antihistamines every few months. Try to take an antihistamine on the days of injections.  Continue budesonide nasal saline wash twice a day. Start allergy injections. 2 shots.  Make first shot appointment.  Had a detailed discussion with patient/family that clinical history is suggestive of allergic rhinitis, and may benefit from allergy immunotherapy (AIT). Discussed in detail regarding the dosing, schedule, side effects (mild to moderate local allergic reaction and rarely systemic allergic reactions including anaphylaxis), and benefits (significant improvement in nasal symptoms, seasonal flares of asthma) of immunotherapy with the patient. There is significant time commitment involved with allergy shots, which includes weekly immunotherapy injections for first 9-12 months and then biweekly to monthly injections for 3-5 years. Consent was signed. I have prescribed epinephrine injectable and demonstrated proper use. For mild symptoms you can take over the counter antihistamines such as Benadryl and monitor symptoms closely. If symptoms worsen or if you have severe symptoms including breathing issues, throat closure, significant swelling, whole body hives, severe diarrhea and vomiting, lightheadedness then inject epinephrine and seek immediate medical care afterwards. Emergency action plan given.  Breathing May use albuterol rescue inhaler 2 puffs or nebulizer every 4 to 6 hours as needed for shortness of breath, chest tightness, coughing, and wheezing. May use albuterol rescue inhaler 2 puffs 5 to 15 minutes prior to  strenuous physical activities. Monitor frequency of use - if you need to use it more than twice per week on a consistent basis let us know.  Breathing control goals:  Full participation in all desired activities (may need albuterol before activity) Albuterol use two times or less a week on average (not counting use with activity) Cough interfering with sleep two times or less a month Oral steroids no more than once a year No hospitalizations   Bee stings Continue to avoid. If you have a reaction let us know. For mild symptoms you can take over the counter antihistamines such as Benadryl 1-2 tablets = 25-50mg  and monitor symptoms closely. If symptoms worsen or if you have severe symptoms including breathing issues, throat closure, significant swelling, whole body hives, severe diarrhea and vomiting, lightheadedness then inject epinephrine and seek immediate medical care afterwards.  Return in about 4 months (around 12/18/2023). Or sooner if needed.   Reducing Pollen Exposure Pollen seasons: trees (spring), grass (summer) and ragweed/weeds (fall). Keep windows closed in your home and car to lower pollen exposure.  Install air conditioning in the bedroom and throughout the house if possible.  Avoid going out in dry windy days - especially early morning. Pollen counts are highest between 5 - 10 AM and on dry, hot and windy days.  Save outside activities for late afternoon or after a heavy rain, when pollen levels are lower.  Avoid mowing of grass if you have grass pollen allergy. Be aware that pollen can also be transported indoors on people and pets.  Dry your clothes in an automatic dryer rather than hanging them outside where they might collect pollen.  Rinse hair and eyes before bedtime.  Control of House Dust Mite Allergen Dust mite allergens are a common trigger of allergy and asthma symptoms. While they can be found throughout the house,  these microscopic creatures thrive in warm,  humid environments such as bedding, upholstered furniture and carpeting. Because so much time is spent in the bedroom, it is essential to reduce mite levels there.  Encase pillows, mattresses, and box springs in special allergen-proof fabric covers or airtight, zippered plastic covers.  Bedding should be washed weekly in hot water (130 F) and dried in a hot dryer. Allergen-proof covers are available for comforters and pillows that can't be regularly washed.  Wash the allergy-proof covers every few months. Minimize clutter in the bedroom. Keep pets out of the bedroom.  Keep humidity less than 50% by using a dehumidifier or air conditioning. You can buy a humidity measuring device called a hygrometer to monitor this.  If possible, replace carpets with hardwood, linoleum, or washable area rugs. If that's not possible, vacuum frequently with a vacuum that has a HEPA filter. Remove all upholstered furniture and non-washable window drapes from the bedroom. Remove all non-washable stuffed toys from the bedroom.  Wash stuffed toys weekly.  Pet Allergen Avoidance: Contrary to popular opinion, there are no "hypoallergenic" breeds of dogs or cats. That is because people are not allergic to an animal's hair, but to an allergen found in the animal's saliva, dander (dead skin flakes) or urine. Pet allergy symptoms typically occur within minutes. For some people, symptoms can build up and become most severe 8 to 12 hours after contact with the animal. People with severe allergies can experience reactions in public places if dander has been transported on the pet owners' clothing. Keeping an animal outdoors is only a partial solution, since homes with pets in the yard still have higher concentrations of animal allergens. Before getting a pet, ask your allergist to determine if you are allergic to animals. If your pet is already considered part of your family, try to minimize contact and keep the pet out of the bedroom  and other rooms where you spend a great deal of time. As with dust mites, vacuum carpets often or replace carpet with a hardwood floor, tile or linoleum. High-efficiency particulate air (HEPA) cleaners can reduce allergen levels over time. While dander and saliva are the source of cat and dog allergens, urine is the source of allergens from rabbits, hamsters, mice and [place] pigs; so ask a non-allergic family member to clean the animal's cage. If you have a pet allergy, talk to your allergist about the potential for allergy immunotherapy (allergy shots). This strategy can often provide long-term relief.

## 2023-08-21 DIAGNOSIS — J301 Allergic rhinitis due to pollen: Secondary | ICD-10-CM | POA: Diagnosis not present

## 2023-08-21 NOTE — Progress Notes (Signed)
Aeroallergen Immunotherapy   Ordering Provider: Dr. Wyline Mood   Patient Details  Name: Jimmy Mcdaniel  MRN: [id number]  Date of Birth: 01-01-1991   Order 1 of 2   Vial Label: G-RW-W-T   0.3 ml (Volume)  BAU Concentration -- 7 Grass Mix* 100,000 ([address] [place], [place], [place], [place] Rye, RedTop, Sweet Vernal, Timothy)  0.2 ml (Volume)  1:20 Concentration -- Bahia  0.3 ml (Volume)  BAU Concentration -- [place] Southern Territ[place] 10,000  0.2 ml (Volume)  1:20 Concentration -- Johnson  0.3 ml (Volume)  1:20 Concentration -- Ragweed Mix  0.5 ml (Volume)  1:20 Concentration -- Weed Mix*  0.5 ml (Volume)  1:20 Concentration -- Eastern 10 Tree Mix (also Sweet Gum)    2.3  ml Extract Subtotal  2.7  ml Diluent  5.0  ml Maintenance Total   Schedule:  B  Blue Vial (1:100,000): Schedule B (6 doses)  Yellow Vial (1:10,000): Schedule B (6 doses)  Green Vial (1:1,000): Schedule B (6 doses)  Red Vial (1:100): Schedule A (14 doses)   Special Instructions: may come in twice a week during build up. Once a week once on red vial.

## 2023-08-21 NOTE — Progress Notes (Signed)
VIALS EXP 08-20-24

## 2023-08-21 NOTE — Progress Notes (Signed)
Aeroallergen Immunotherapy  Ordering Provider: Dr. Wyline Mood  Patient Details Name: Jimmy Mcdaniel MRN: [id number] Date of Birth: 27-Apr-1991  Order 2 of 2  Vial Label: Dm-C-D-  0.5 ml (Volume)  1:10 Concentration -- Cat Hair 0.5 ml (Volume)  1:10 Concentration -- Dog Epithelia 0.5 ml (Volume)   AU Concentration -- Mite Mix (DF 5,000 & DP 5,000)   1.5  ml Extract Subtotal 3.5  ml Diluent 5.0  ml Maintenance Total  Schedule:  B Blue Vial (1:100,000): Schedule B (6 doses) Yellow Vial (1:10,000): Schedule B (6 doses) Green Vial (1:1,000): Schedule B (6 doses) Red Vial (1:100): Schedule A (14 doses)  Special Instructions: may come in twice a week during build up. Once a week once on red vial.

## 2023-08-22 DIAGNOSIS — J3081 Allergic rhinitis due to animal (cat) (dog) hair and dander: Secondary | ICD-10-CM | POA: Diagnosis not present

## 2023-09-16 ENCOUNTER — Other Ambulatory Visit: Payer: Self-pay | Admitting: *Deleted

## 2023-09-16 ENCOUNTER — Ambulatory Visit (INDEPENDENT_AMBULATORY_CARE_PROVIDER_SITE_OTHER): Payer: Managed Care, Other (non HMO) | Admitting: *Deleted

## 2023-09-16 DIAGNOSIS — J309 Allergic rhinitis, unspecified: Secondary | ICD-10-CM

## 2023-09-16 MED ORDER — EPINEPHRINE 0.3 MG/0.3ML IJ SOAJ: 0.3000 mg | INTRAMUSCULAR | 1 refills | Status: AC | PRN

## 2023-09-16 NOTE — Progress Notes (Signed)
 Immunotherapy   Patient Details  Name: Jimmy Mcdaniel MRN: [id number] Date of Birth: 1991-05-01  09/16/2023  Bryson Dames started injections for  G-RW-W-T, DM-C-D Following schedule: B  Frequency:2 times per week Epi-Pen:Epi-Pen Available  Consent signed and patient instructions given. Patient started Allergy injections and received 0.75mL of G-RW-W-T in the RUA and 0.25mL DM-C-D in the LUA. Patient waited 30 minutes in office and did not experience any issues.   Adair Lemar Fernandez-Vernon 09/16/2023, 9:48 AM

## 2023-09-23 ENCOUNTER — Ambulatory Visit (INDEPENDENT_AMBULATORY_CARE_PROVIDER_SITE_OTHER): Payer: Self-pay

## 2023-09-23 DIAGNOSIS — J309 Allergic rhinitis, unspecified: Secondary | ICD-10-CM | POA: Diagnosis not present

## 2023-09-23 IMAGING — CR DG CHEST 2V
2 series · 2 of 2 positions shown · non-contrast
Comparison: June 16, 2021.

CLINICAL DATA: Shortness of breath.

EXAM:
CHEST - 2 VIEW

[w chest pa]
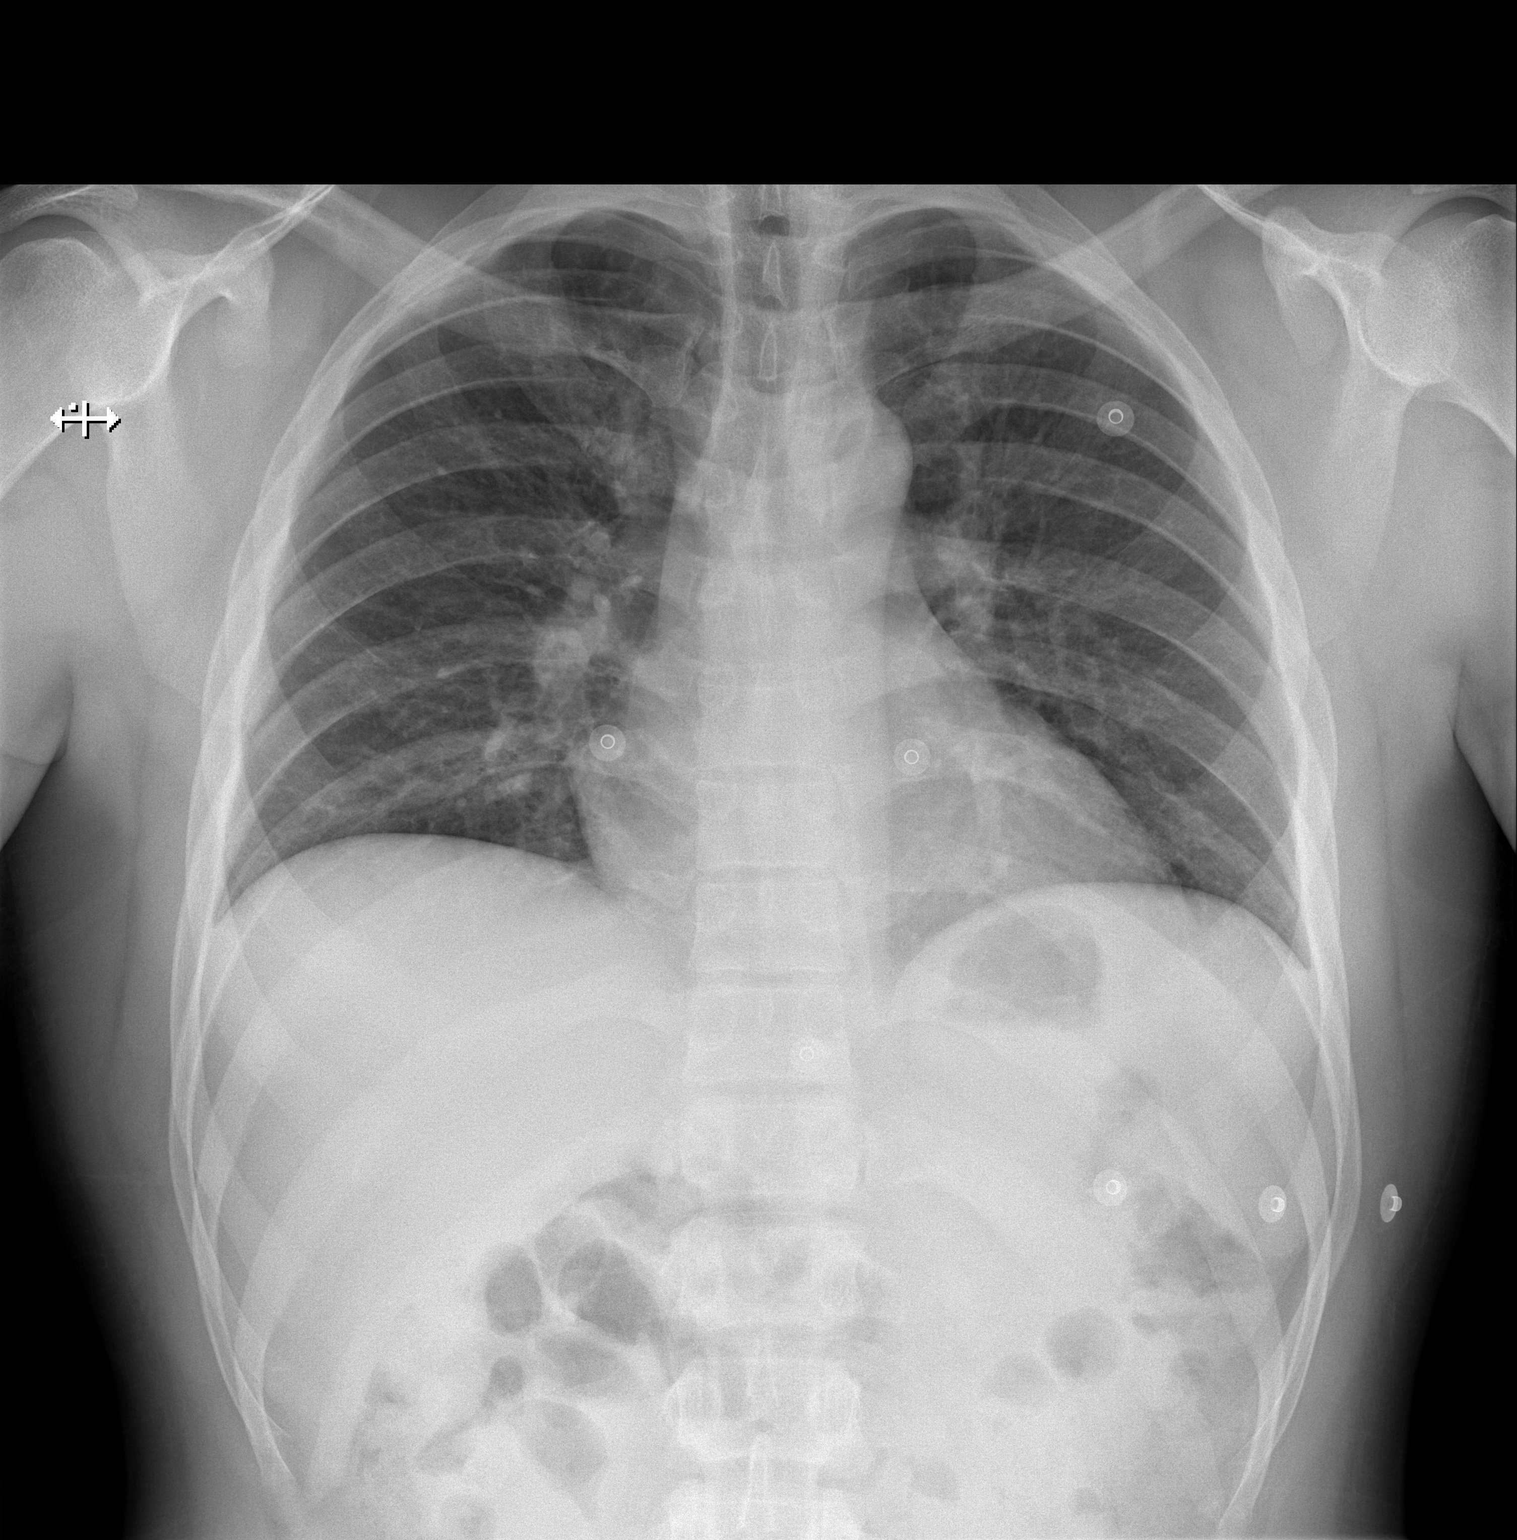

[w chest lat]
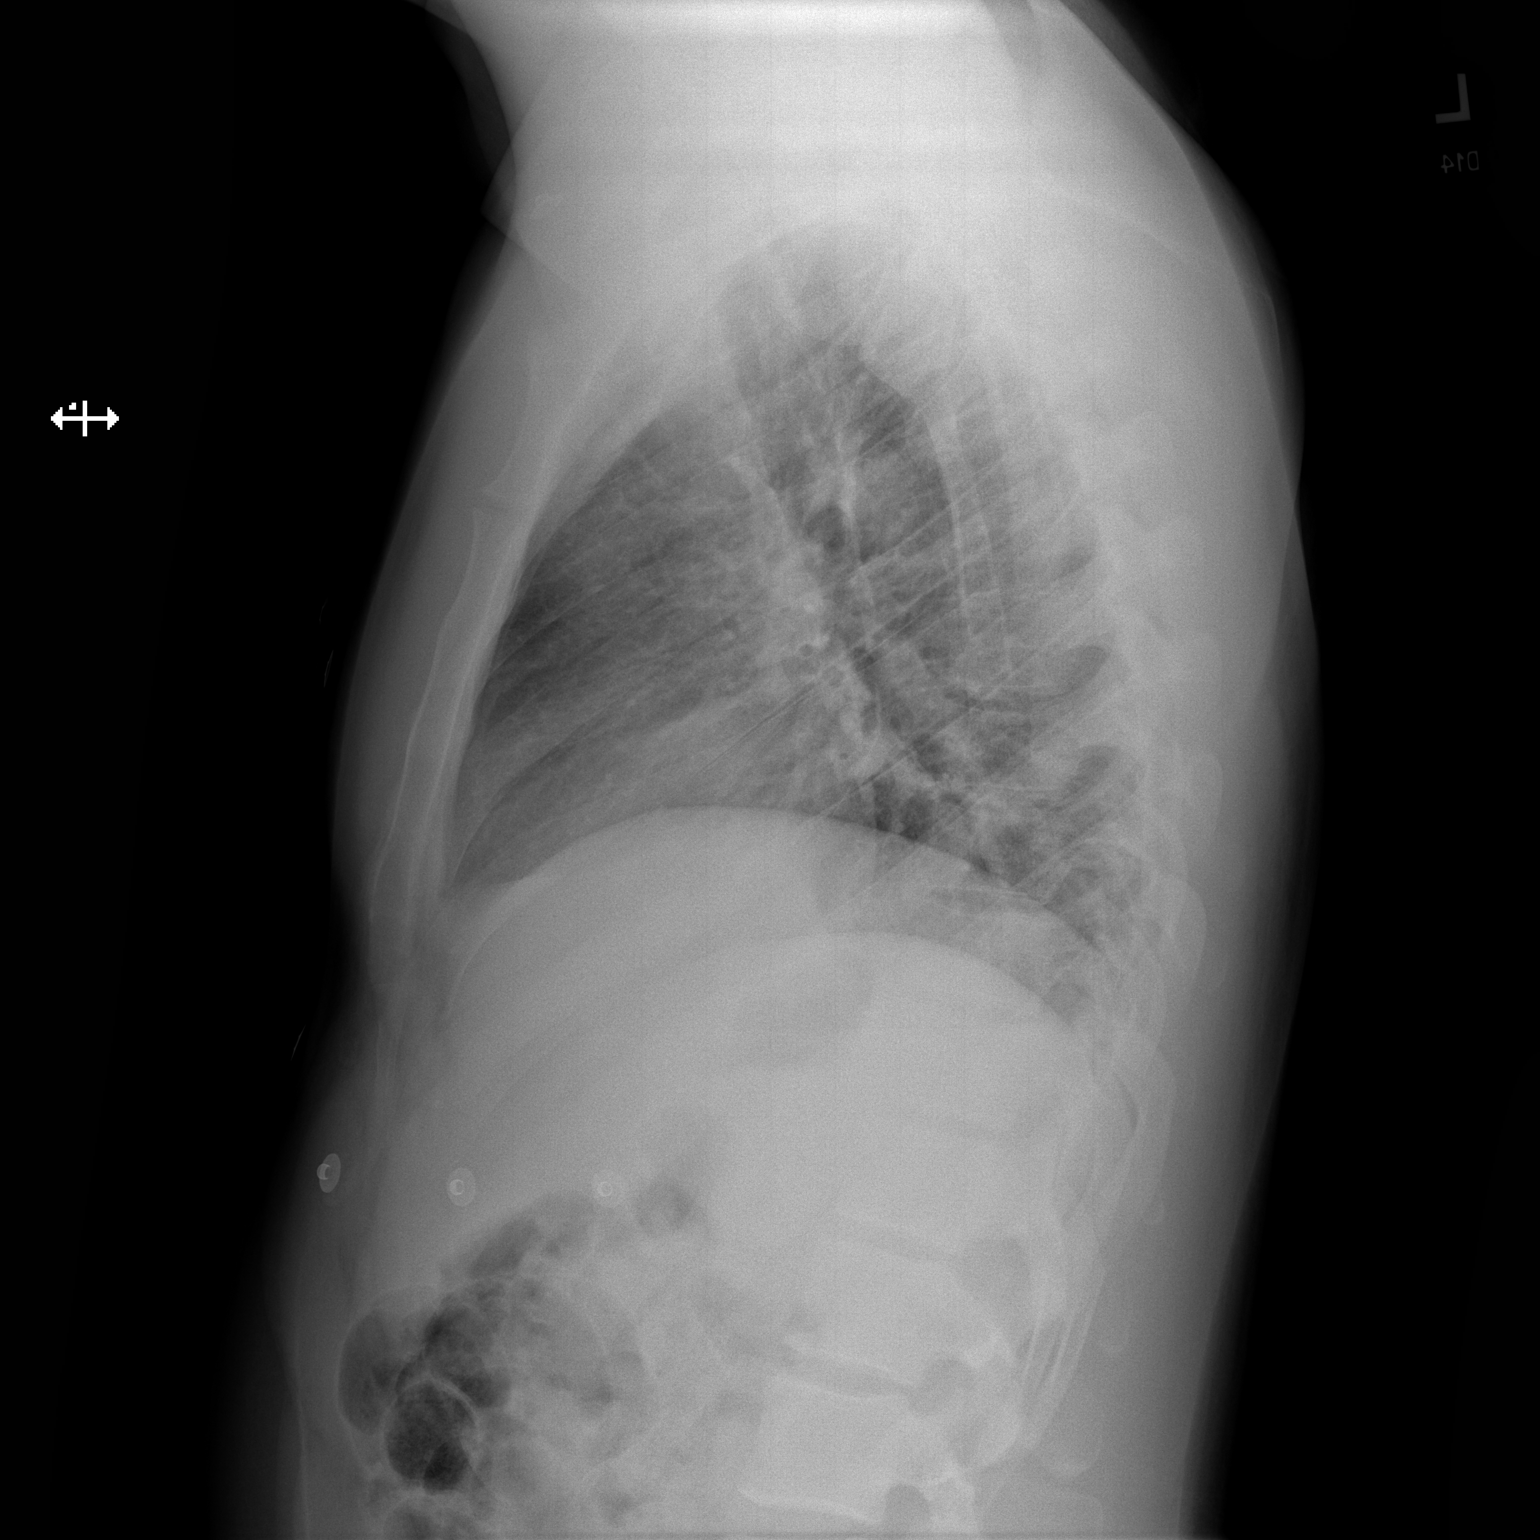

[2 of 2 positions shown; findings below may reference images not displayed]

FINDINGS: The heart size and mediastinal contours are within normal limits.
Both lungs are clear. The visualized skeletal structures are
unremarkable.
IMPRESSION: No active cardiopulmonary disease.

## 2023-10-02 ENCOUNTER — Ambulatory Visit (INDEPENDENT_AMBULATORY_CARE_PROVIDER_SITE_OTHER)

## 2023-10-02 DIAGNOSIS — J309 Allergic rhinitis, unspecified: Secondary | ICD-10-CM

## 2023-10-16 ENCOUNTER — Ambulatory Visit (INDEPENDENT_AMBULATORY_CARE_PROVIDER_SITE_OTHER): Payer: Self-pay

## 2023-10-16 DIAGNOSIS — J309 Allergic rhinitis, unspecified: Secondary | ICD-10-CM | POA: Diagnosis not present

## 2023-10-23 ENCOUNTER — Ambulatory Visit (INDEPENDENT_AMBULATORY_CARE_PROVIDER_SITE_OTHER): Payer: Self-pay

## 2023-10-23 DIAGNOSIS — J309 Allergic rhinitis, unspecified: Secondary | ICD-10-CM

## 2023-10-28 ENCOUNTER — Ambulatory Visit (INDEPENDENT_AMBULATORY_CARE_PROVIDER_SITE_OTHER): Payer: Self-pay

## 2023-10-28 DIAGNOSIS — J309 Allergic rhinitis, unspecified: Secondary | ICD-10-CM

## 2023-11-19 ENCOUNTER — Ambulatory Visit (INDEPENDENT_AMBULATORY_CARE_PROVIDER_SITE_OTHER)

## 2023-11-19 DIAGNOSIS — J309 Allergic rhinitis, unspecified: Secondary | ICD-10-CM

## 2023-11-24 ENCOUNTER — Ambulatory Visit (INDEPENDENT_AMBULATORY_CARE_PROVIDER_SITE_OTHER): Payer: Self-pay

## 2023-11-24 DIAGNOSIS — J309 Allergic rhinitis, unspecified: Secondary | ICD-10-CM | POA: Diagnosis not present

## 2023-11-27 ENCOUNTER — Ambulatory Visit (INDEPENDENT_AMBULATORY_CARE_PROVIDER_SITE_OTHER): Payer: Self-pay

## 2023-11-27 DIAGNOSIS — J309 Allergic rhinitis, unspecified: Secondary | ICD-10-CM | POA: Diagnosis not present

## 2023-12-08 ENCOUNTER — Ambulatory Visit (INDEPENDENT_AMBULATORY_CARE_PROVIDER_SITE_OTHER): Payer: Self-pay

## 2023-12-08 DIAGNOSIS — J309 Allergic rhinitis, unspecified: Secondary | ICD-10-CM

## 2023-12-11 ENCOUNTER — Ambulatory Visit (INDEPENDENT_AMBULATORY_CARE_PROVIDER_SITE_OTHER)

## 2023-12-11 DIAGNOSIS — J309 Allergic rhinitis, unspecified: Secondary | ICD-10-CM | POA: Diagnosis not present

## 2023-12-18 ENCOUNTER — Ambulatory Visit (INDEPENDENT_AMBULATORY_CARE_PROVIDER_SITE_OTHER)

## 2023-12-18 DIAGNOSIS — J309 Allergic rhinitis, unspecified: Secondary | ICD-10-CM | POA: Diagnosis not present

## 2023-12-24 ENCOUNTER — Ambulatory Visit (INDEPENDENT_AMBULATORY_CARE_PROVIDER_SITE_OTHER): Payer: Self-pay

## 2023-12-24 DIAGNOSIS — J309 Allergic rhinitis, unspecified: Secondary | ICD-10-CM | POA: Diagnosis not present

## 2024-01-05 ENCOUNTER — Ambulatory Visit (INDEPENDENT_AMBULATORY_CARE_PROVIDER_SITE_OTHER): Payer: Self-pay

## 2024-01-05 DIAGNOSIS — J309 Allergic rhinitis, unspecified: Secondary | ICD-10-CM | POA: Diagnosis not present

## 2024-01-26 ENCOUNTER — Other Ambulatory Visit: Payer: Self-pay

## 2024-01-26 ENCOUNTER — Emergency Department (HOSPITAL_COMMUNITY)
Admission: EM | Admit: 2024-01-26 | Discharge: 2024-01-26 | Discharge status: Against Medical Advice | Attending: Emergency Medicine | Admitting: Emergency Medicine

## 2024-01-26 DIAGNOSIS — G43809 Other migraine, not intractable, without status migrainosus: Secondary | ICD-10-CM | POA: Insufficient documentation

## 2024-01-26 DIAGNOSIS — R519 Headache, unspecified: Secondary | ICD-10-CM | POA: Diagnosis present

## 2024-01-26 DIAGNOSIS — J45909 Unspecified asthma, uncomplicated: Secondary | ICD-10-CM | POA: Insufficient documentation

## 2024-01-26 LAB — BASIC METABOLIC PANEL WITH GFR
Anion gap: 8 (ref 5–15)
BUN: 17 mg/dL (ref 6–20)
CO2: 27 mmol/L (ref 22–32)
Calcium: 9.3 mg/dL (ref 8.9–10.3)
Chloride: 104 mmol/L (ref 98–111)
Creatinine, Ser: 1.01 mg/dL (ref 0.61–1.24)
GFR, Estimated: >60 mL/min (ref >60)
Glucose, Bld: 100 mg/dL — ABNORMAL HIGH (ref 70–99)
Potassium: 4.5 mmol/L (ref 3.5–5.1)
Sodium: 139 mmol/L (ref 135–145)

## 2024-01-26 LAB — CBC WITH DIFFERENTIAL/PLATELET
Abs Immature Granulocytes: 0.03 K/uL (ref 0.00–0.07)
Basophils Absolute: 0 K/uL (ref 0.0–0.1)
Basophils Relative: 0 %
Eosinophils Absolute: 0 K/uL (ref 0.0–0.5)
Eosinophils Relative: 0 %
HCT: 47.6 % (ref 39.0–52.0)
Hemoglobin: 15 g/dL (ref 13.0–17.0)
Immature Granulocytes: 0 %
Lymphocytes Relative: 18 %
Lymphs Abs: 1.3 K/uL (ref 0.7–4.0)
MCH: 27.1 pg (ref 26.0–34.0)
MCHC: 31.5 g/dL (ref 30.0–36.0)
MCV: 86.1 fL (ref 80.0–100.0)
Monocytes Absolute: 0.5 K/uL (ref 0.1–1.0)
Monocytes Relative: 7 %
Neutro Abs: 5.1 K/uL (ref 1.7–7.7)
Neutrophils Relative %: 75 %
Platelets: 138 K/uL — ABNORMAL LOW (ref 150–400)
RBC: 5.53 MIL/uL (ref 4.22–5.81)
RDW: 11.9 % (ref 11.5–15.5)
WBC: 6.9 K/uL (ref 4.0–10.5)
nRBC: 0 % (ref 0.0–0.2)

## 2024-01-26 MED ORDER — PROCHLORPERAZINE EDISYLATE 10 MG/2ML IJ SOLN
10.0000 mg | Freq: Once | INTRAMUSCULAR | Status: AC
  Administered 2024-01-26: 10 mg via INTRAVENOUS
  Filled 2024-01-26: qty 2

## 2024-01-26 MED ORDER — DIPHENHYDRAMINE HCL 50 MG/ML IJ SOLN
12.5000 mg | Freq: Once | INTRAMUSCULAR | Status: AC
  Administered 2024-01-26: 12.5 mg via INTRAVENOUS
  Filled 2024-01-26: qty 1

## 2024-01-26 MED ORDER — KETOROLAC TROMETHAMINE 15 MG/ML IJ SOLN
15.0000 mg | Freq: Once | INTRAMUSCULAR | Status: AC
  Administered 2024-01-26: 15 mg via INTRAVENOUS
  Filled 2024-01-26: qty 1

## 2024-01-26 NOTE — ED Notes (Signed)
 After administering medications, the patient `started complaining about his IV being uncomfortable and wanted it taken out. After checking with the provider, same was removed. 10 minutes after the IV was removed, the patient said he needed to use the restroom. He never returned to his bed after walking away to the restroom. We were unable to locate him after this. The provider was notified

## 2024-01-26 NOTE — ED Triage Notes (Addendum)
 Patient to ED by EMS from work. He c/o headache, shakiness and chills that started last night. He denies CP, SOB or changes in diet. Voices he took tylenol  this am causing N/V and shakes.

## 2024-01-26 NOTE — ED Provider Notes (Signed)
 [place] EMERGENCY DEPARTMENT AT [facility] Provider Note   CSN: [id number] Arrival date & time: 01/26/24  9146     Patient presents with: Headache   Jimmy Mcdaniel is a 33 y.o. male.   33 year old presents today for concern headache, and nausea.  He does have history of migraines.  This feels typical for his migraine headache.  Ongoing for the past 24 hours.  Not severe at the time of onset.  Has progressively worsened.  Tried over-the-counter medicine without relief.  The history is provided by the patient. No language interpreter was used.       Prior to Admission medications   Medication Sig Start Date End Date Taking? Authorizing Provider  budesonide  (PULMICORT ) 0.5 MG/2ML nebulizer solution Use 1 vial in the saline rinse bottle twice a day for nasal congestion. 08/12/23   Luke Orlan HERO, DO  EPINEPHrine  0.3 mg/0.3 mL IJ SOAJ injection Inject 0.3 mg into the muscle as needed for anaphylaxis. 09/16/23   Luke Orlan HERO, DO    Allergies: Patient has no known allergies.    Review of Systems  Constitutional:  Negative for fever.  Eyes:  Positive for photophobia. Negative for visual disturbance.  Musculoskeletal:  Negative for neck pain.  Neurological:  Positive for headaches.  All other systems reviewed and are negative.   Updated Vital Signs BP (!) 129/91 (BP Location: Left Arm)   Pulse (!) 49   Temp 98 F (36.7 C) (Oral)   Resp 16   Ht 6' (1.829 m)   Wt 97.5 kg   SpO2 100%   BMI 29.16 kg/m   Physical Exam Vitals and nursing note reviewed.  Constitutional:      General: He is not in acute distress.    Appearance: Normal appearance. He is not ill-appearing.  HENT:     Head: Normocephalic and atraumatic.     Nose: Nose normal.  Eyes:     Conjunctiva/sclera: Conjunctivae normal.  Pulmonary:     Effort: Pulmonary effort is normal. No respiratory distress.  Musculoskeletal:        General: No deformity.  Skin:    Findings: No rash.  Neurological:      Mental Status: He is alert.     Comments: Cranial nerves III through XII intact.  Full range of motion in bilateral upper and lower extremities with 5/5 strength.  No pronator drift.  Pupils equal round reactive to light.  Tongue midline.     (all labs ordered are listed, but only abnormal results are displayed) Labs Reviewed  CBC WITH DIFFERENTIAL/PLATELET  BASIC METABOLIC PANEL WITH GFR    EKG: None  Radiology: No results found.   Procedures   Medications Ordered in the ED  ketorolac  (TORADOL ) 15 MG/ML injection 15 mg (15 mg Intravenous Given 01/26/24 1017)  prochlorperazine  (COMPAZINE ) injection 10 mg (10 mg Intravenous Given 01/26/24 1018)  diphenhydrAMINE  (BENADRYL ) injection 12.5 mg (12.5 mg Intravenous Given 01/26/24 1018)                                    Medical Decision Making Amount and/or Complexity of Data Reviewed Labs: ordered.  Risk Prescription drug management.   Medical Decision Making / ED Course   This patient presents to the ED for concern of headache, this involves an extensive number of treatment options, and is a complaint that carries with it a high risk of complications and morbidity.  The differential diagnosis includes migraine headache, tension headache, subarachnoid hemorrhage, cluster headache Low suspicion for subarachnoid hemorrhage due to symptoms being typical for his migraine, neurovascularly intact.  Not sudden onset and severe.  MDM: 33 year old male presents today for concern of above-mentioned complaints. Will provide migraine cocktail and reevaluate.  Patient had notified nurse that the IV was becoming uncomfortable.  This was removed.  While lab work was pending and after administration of migraine cocktail patient requested to go to the bathroom and he never returned. Patient eloped prior to completion of his visit.  Lab Tests: -I ordered, reviewed, and interpreted labs.   The pertinent results include:   Labs Reviewed  CBC  WITH DIFFERENTIAL/PLATELET  BASIC METABOLIC PANEL WITH GFR      EKG  EKG Interpretation Date/Time:    Ventricular Rate:    PR Interval:    QRS Duration:    QT Interval:    QTC Calculation:   R Axis:      Text Interpretation:           Medicines ordered and prescription drug management: Meds ordered this encounter  Medications   ketorolac  (TORADOL ) 15 MG/ML injection 15 mg   prochlorperazine  (COMPAZINE ) injection 10 mg   diphenhydrAMINE  (BENADRYL ) injection 12.5 mg    -I have reviewed the patients home medicines and have made adjustments as needed    Reevaluation: After the interventions noted above, I reevaluated the patient and found that they have :Patient eloped prior to reevaluation  Co morbidities that complicate the patient evaluation  Past Medical History:  Diagnosis Date   Asthma    Nasal congestion    Nosebleed       Dispostion: Patient eloped prior to completion of the visit    Final diagnoses:  Other migraine without status migrainosus, not intractable    ED Discharge Orders     None          Hildegard Loge, PA-C 01/26/24 1137    Yolande Lamar BROCKS, MD 01/28/24 1128
